# Patient Record
Sex: Female | Born: 2000 | Race: Black or African American | Hispanic: No | Marital: Single | Smoking: Former smoker
Health system: Southern US, Community
[De-identification: ages and names within clinical notes are randomized; demographics above are authoritative.]

## PROBLEM LIST (undated history)

## (undated) ENCOUNTER — Inpatient Hospital Stay: Payer: Self-pay

## (undated) HISTORY — PX: NO PAST SURGERIES: SHX2092

---
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
## 2018-10-21 LAB — HM HIV SCREENING LAB: HM HIV Screening: NEGATIVE

## 2018-10-21 LAB — HM HEPATITIS C SCREENING LAB: HM Hepatitis Screen: NEGATIVE

## 2019-03-11 DIAGNOSIS — E669 Obesity, unspecified: Secondary | ICD-10-CM

## 2019-03-12 ENCOUNTER — Ambulatory Visit (LOCAL_COMMUNITY_HEALTH_CENTER): Payer: Medicaid Other | Admitting: Physician Assistant

## 2019-03-12 ENCOUNTER — Ambulatory Visit: Payer: Medicaid Other

## 2019-03-12 ENCOUNTER — Other Ambulatory Visit: Payer: Self-pay

## 2019-03-12 ENCOUNTER — Encounter: Payer: Self-pay | Admitting: Physician Assistant

## 2019-03-12 VITALS — BP 112/66 | Ht 62.0 in | Wt 188.0 lb

## 2019-03-12 DIAGNOSIS — Z3009 Encounter for other general counseling and advice on contraception: Secondary | ICD-10-CM | POA: Diagnosis not present

## 2019-03-12 DIAGNOSIS — Z30013 Encounter for initial prescription of injectable contraceptive: Secondary | ICD-10-CM | POA: Diagnosis not present

## 2019-03-12 MED ORDER — MEDROXYPROGESTERONE ACETATE 150 MG/ML IM SUSP
150.0000 mg | Freq: Once | INTRAMUSCULAR | Status: AC
  Administered 2019-03-12: 11:00:00 150 mg via INTRAMUSCULAR

## 2019-03-12 NOTE — Progress Notes (Signed)
Pt here for Depo restart. Last Depo per [facility] records was 02/06/2018. Pt desires Depo restart and is currently using condoms as birth control and reports condom use with all sex. Pt concerned about her menstraul period as she started 02/18/2019 and is still on period now. Reports flow ranges from heavy to spotting.Ronny Bacon, RN

## 2019-03-12 NOTE — Progress Notes (Signed)
Pt received Depo 150mg  IM today per Antoine Primas, PA order and pt tolerated well. Provider orders completed.Ronny Bacon, RN

## 2019-03-12 NOTE — Progress Notes (Signed)
   [facility] problem visit  [place] Department  Subjective:  Laura Higgins is a 18 y.o. being seen today for to restart Depo.  Chief Complaint  Patient presents with  . Contraception    restart Depo    HPI  Patient with irregular bleeding for almost 1 month.  Per chart, last RP was 11/2017 and last Depo was 01/2018.  Patient states that starting in January of 2020, started having regular periods and then in September she started bleeding and is still bleeding with the flow varying between light and heavy.  Reports that she did start a new job and that has been stressful.  Declines pelvic today due to bleeding.  Would like Depo in arm today.   Does the patient have a current or past history of drug use? No   No components found for: HCV]   Health Maintenance Due  Topic Date Due  . HIV Screening  01/22/2014  . TETANUS/TDAP  01/22/2018  . INFLUENZA VACCINE  01/09/2019    Review of Systems  All other systems reviewed and are negative.   The following portions of the patient's history were reviewed and updated as appropriate: allergies, current medications, past family history, past medical history, past social history, past surgical history and problem list. Problem list updated.   See flowsheet for other program required questions.  Objective:   Vitals:   03/12/19 1004  BP: 112/66  Weight: 188 lb (85.3 kg)  Height: 5\' 2"  (1.575 m)    Physical Exam Vitals signs reviewed.  Constitutional:      Appearance: Normal appearance.  HENT:     Head: Normocephalic and atraumatic.  Pulmonary:     Effort: Pulmonary effort is normal.  Neurological:     Mental Status: She is alert and oriented to person, place, and time.  Psychiatric:        Mood and Affect: Mood normal.        Behavior: Behavior normal.        Thought Content: Thought content normal.        Judgment: Judgment normal.       Assessment and Plan:  Laura Higgins is a 18  y.o. female presenting to the [facility] Department for a Women's Health problem visit  1. Encounter for counseling regarding contraception Counseled that irregular bleeding after d/c Depo is normal for up to 18 months.   Counseled that her irregular bleeding may be related to added stress of new job. Rec condoms with all sex for 2 weeks after shot today and always for STD protection. RTC in ~12 weeks for RP and depo.  2. Initiation of Depo Provera OK for Depo 150mg  IM today. Rec take IB 800 mg q 8 hr with food for 3-5 days in a row to help d/c bleeding along with getting Depo today. Call clinic if bleeding does not slow to spotting only or d/c after taking IB. - medroxyPROGESTERone (DEPO-PROVERA) injection 150 mg     No follow-ups on file.  No future appointments.  Jerene Dilling, PA

## 2019-05-24 ENCOUNTER — Ambulatory Visit: Payer: Medicaid Other

## 2019-06-08 ENCOUNTER — Ambulatory Visit: Payer: Medicaid Other

## 2019-06-16 ENCOUNTER — Ambulatory Visit (LOCAL_COMMUNITY_HEALTH_CENTER): Payer: Medicaid Other

## 2019-06-16 ENCOUNTER — Other Ambulatory Visit: Payer: Self-pay

## 2019-06-16 VITALS — BP 115/78 | Ht 60.0 in | Wt 187.0 lb

## 2019-06-16 DIAGNOSIS — Z3042 Encounter for surveillance of injectable contraceptive: Secondary | ICD-10-CM

## 2019-06-16 DIAGNOSIS — Z30013 Encounter for initial prescription of injectable contraceptive: Secondary | ICD-10-CM | POA: Diagnosis not present

## 2019-06-16 DIAGNOSIS — Z3009 Encounter for other general counseling and advice on contraception: Secondary | ICD-10-CM

## 2019-06-16 MED ORDER — MULTIVITAMINS PO CAPS: 1.0000 | ORAL_CAPSULE | Freq: Every day | ORAL | 0 refills | Status: DC

## 2019-06-16 MED ORDER — MEDROXYPROGESTERONE ACETATE 150 MG/ML IM SUSP
150.0000 mg | Freq: Once | INTRAMUSCULAR | Status: AC
  Administered 2019-06-16: 150 mg via INTRAMUSCULAR

## 2019-06-16 NOTE — Progress Notes (Signed)
Last physical at [facility] was 11/13/2017 and pap due 01/2020 (per [facility] records). Last provider visit at [facility] was 03/12/2019, restarted depo, one time depo order by Sadie Haber, PA; 13.5 weeks post depo today. Consulted with provider regarding pt request to continue with depo today. DMPA 150 mg IM administered today per verbal order by Samara Snide, PA. Pt expresses understanding to schedule a physical when next depo is due. MVI dispensed per standing order.

## 2019-08-04 ENCOUNTER — Ambulatory Visit: Payer: Self-pay | Admitting: Advanced Practice Midwife

## 2019-08-04 ENCOUNTER — Other Ambulatory Visit: Payer: Self-pay

## 2019-08-04 ENCOUNTER — Encounter: Payer: Self-pay | Admitting: Advanced Practice Midwife

## 2019-08-04 DIAGNOSIS — F17200 Nicotine dependence, unspecified, uncomplicated: Secondary | ICD-10-CM | POA: Insufficient documentation

## 2019-08-04 DIAGNOSIS — Z113 Encounter for screening for infections with a predominantly sexual mode of transmission: Secondary | ICD-10-CM

## 2019-08-04 NOTE — Progress Notes (Signed)
Here today for STD screening. Declines bloodwork. Zhaire Locker, RN ? ?

## 2019-08-04 NOTE — Progress Notes (Signed)
[place] results reviewed. Per standing orders no treatment indicated. Angela Platner, RN  

## 2019-08-04 NOTE — Progress Notes (Signed)
[facility] Department STI clinic/screening visit  Subjective:  Laura Higgins is a 19 y.o. SBF female being seen today for an STI screening visit. The patient reports they do have symptoms.  Patient reports that they do not desire a pregnancy in the next year.   They reported they are not interested in discussing contraception today.  Patient's last menstrual period was 07/28/2019 (approximate).   Patient has the following medical conditions:   Patient Active Problem List   Diagnosis Date Noted  . Obesity, unspecified 06/13/2015    Chief Complaint  Patient presents with  . SEXUALLY TRANSMITTED DISEASE    HPI  Patient reports cramps since last week in lower abdomen intermittently.  DMPA 01/2018.  03/2019, 06/16/19.   See flowsheet for further details and programmatic requirements.    The following portions of the patient's history were reviewed and updated as appropriate: allergies, current medications, past medical history, past social history, past surgical history and problem list.  Objective:  There were no vitals filed for this visit.  Physical Exam Vitals and nursing note reviewed.  Constitutional:      Appearance: Normal appearance.  HENT:     Head: Normocephalic and atraumatic.     Mouth/Throat:     Mouth: Mucous membranes are moist.     Pharynx: Oropharynx is clear. No oropharyngeal exudate or posterior oropharyngeal erythema.  Pulmonary:     Effort: Pulmonary effort is normal.  Abdominal:     General: Abdomen is flat.     Palpations: There is no mass.     Tenderness: There is no abdominal tenderness. There is no rebound.  Genitourinary:    General: Normal vulva.     Exam position: Lithotomy position.     Pubic Area: No rash or pubic lice.      Labia:        Right: No rash or lesion.        Left: No rash or lesion.      Vagina: Bleeding (red menses bleeding) present. No vaginal discharge, erythema or lesions.     Cervix: Normal.     Uterus:  Normal.      Adnexa: Right adnexa normal and left adnexa normal.     Rectum: Normal.  Lymphadenopathy:     Head:     Right side of head: No preauricular or posterior auricular adenopathy.     Left side of head: No preauricular or posterior auricular adenopathy.     Cervical: No cervical adenopathy.     Upper Body:     Right upper body: No supraclavicular or axillary adenopathy.     Left upper body: No supraclavicular or axillary adenopathy.     Lower Body: No right inguinal adenopathy. No left inguinal adenopathy.  Skin:    General: Skin is warm and dry.     Findings: No rash.  Neurological:     Mental Status: She is alert and oriented to person, place, and time.   No CMT   Assessment and Plan:  Laura Higgins is a 19 y.o. female presenting to the [facility] Department for STI screening  1. Screening examination for venereal disease Treat wet [place] per standing orders Immunization nurse consult Next DMPA due 09/01/19.  This bleeding is because pt has reinitiated DMPA.  Pt counseled may take Ibuprofen 800 mg q 8 hrs with food.  If symptoms worsen then go to ER - [facility], [facility], [facility]  Return if symptoms worsen or fail to improve.  No future appointments.  Alberteen Spindle, CNM

## 2019-08-05 LAB — WET PREP FOR TRICH, YEAST, CLUE
Trichomonas Exam: NEGATIVE
Yeast Exam: NEGATIVE

## 2019-08-09 DIAGNOSIS — A749 Chlamydial infection, unspecified: Secondary | ICD-10-CM

## 2019-08-09 HISTORY — DX: Chlamydial infection, unspecified: A74.9

## 2019-08-12 ENCOUNTER — Other Ambulatory Visit: Payer: Self-pay

## 2019-08-12 ENCOUNTER — Telehealth: Payer: Self-pay

## 2019-08-12 ENCOUNTER — Ambulatory Visit: Payer: Self-pay

## 2019-08-12 DIAGNOSIS — A749 Chlamydial infection, unspecified: Secondary | ICD-10-CM

## 2019-08-12 MED ORDER — AZITHROMYCIN 500 MG PO TABS
1000.0000 mg | ORAL_TABLET | Freq: Once | ORAL | Status: AC
Start: 2019-08-12 — End: 2019-08-12
  Administered 2019-08-12: 1000 mg via ORAL

## 2019-08-12 NOTE — Telephone Encounter (Signed)
TC to patient. Verified ID via password/SS#. Informed of positive Chlamydia and need for tx. Instructed to eat before visit and have partner call for tx appt. Appt scheduled. Quinterius Gaida, RN   

## 2019-09-29 ENCOUNTER — Ambulatory Visit: Payer: Medicaid Other

## 2019-11-10 ENCOUNTER — Ambulatory Visit (LOCAL_COMMUNITY_HEALTH_CENTER): Payer: Medicaid Other | Admitting: Advanced Practice Midwife

## 2019-11-10 ENCOUNTER — Encounter: Payer: Self-pay | Admitting: Advanced Practice Midwife

## 2019-11-10 ENCOUNTER — Other Ambulatory Visit: Payer: Self-pay

## 2019-11-10 VITALS — BP 111/73 | Ht 60.0 in | Wt 188.4 lb

## 2019-11-10 DIAGNOSIS — Z30013 Encounter for initial prescription of injectable contraceptive: Secondary | ICD-10-CM | POA: Diagnosis not present

## 2019-11-10 DIAGNOSIS — Z3009 Encounter for other general counseling and advice on contraception: Secondary | ICD-10-CM | POA: Diagnosis not present

## 2019-11-10 DIAGNOSIS — A599 Trichomoniasis, unspecified: Secondary | ICD-10-CM | POA: Insufficient documentation

## 2019-11-10 DIAGNOSIS — F1290 Cannabis use, unspecified, uncomplicated: Secondary | ICD-10-CM | POA: Insufficient documentation

## 2019-11-10 LAB — WET PREP FOR TRICH, YEAST, CLUE
Trichomonas Exam: POSITIVE — AB
Yeast Exam: NEGATIVE

## 2019-11-10 LAB — PREGNANCY, URINE: Preg Test, Ur: NEGATIVE

## 2019-11-10 MED ORDER — MEDROXYPROGESTERONE ACETATE 150 MG/ML IM SUSP
150.0000 mg | INTRAMUSCULAR | Status: AC
Start: 2019-11-10 — End: 2020-11-04
  Administered 2019-11-10: 150 mg via INTRAMUSCULAR

## 2019-11-10 MED ORDER — MULTIVITAMINS PO CAPS: 1.0000 | ORAL_CAPSULE | Freq: Every day | ORAL | 0 refills | Status: AC

## 2019-11-10 MED ORDER — METRONIDAZOLE 500 MG PO TABS: 2000.0000 mg | ORAL_TABLET | Freq: Once | ORAL | 0 refills | Status: AC

## 2019-11-10 NOTE — Progress Notes (Signed)
[employer] results reviewed. Patient treated for Trich per standing orders. Depo given and tolerated well. Tawny Hopping, RN

## 2019-11-10 NOTE — Progress Notes (Signed)
Family Planning Visit- Initial Visit  Subjective:  Laura Higgins is a 19 y.o. SBF nullip smoker G0P0000   being seen today for an initial well woman visit and to discuss family planning options.  She is currently using late for DMPA--nothing for pregnancy prevention. Patient reports she does not want a pregnancy in the next year.  Patient has the following medical conditions has Obesity BMI=36.7; Smoker Black & Milds 2/day; Marijuana use daily; and Trichomonas infection on their problem list.  Chief Complaint  Patient presents with  . Contraception  . Gynecologic Exam    Patient reports Last DMPA 06/16/19.  Last PE 11/13/17.  Smokes 2 Black & Milds/day.  Last MJ yesterday daily.  LMP 11/08/19.  Last sex 10/23/19 with condom; with partner x 4 mo.  1 sex partner in last 3 mo.  Last ETOH 09/24/19 (4 shots Henessey) 1x/mo.  Patient denies   Body mass index is 36.79 kg/m. - Patient is eligible for diabetes screening based on BMI and age >56?  not applicable EP3I ordered? not applicable  Patient reports 1 of partners in last year. Desires STI screening?  Yes  Has patient been screened once for HCV in the past?  No  No results found for: HCVAB  Does the patient have current of drug use, have a partner with drug use, and/or has been incarcerated since last result? No  If yes-- Screen for HCV through [facility]   Does the patient meet criteria for HBV testing? No  Criteria:  -Household, sexual or needle sharing contact with HBV -History of drug use -HIV positive -Those with known Hep C   Health Maintenance Due  Topic Date Due  . COVID-19 Vaccine (1) Never done  . CHLAMYDIA SCREENING  Never done  . TETANUS/TDAP  Never done    Review of Systems  All other systems reviewed and are negative.   The following portions of the patient's history were reviewed and updated as appropriate: allergies, current medications, past family history, past medical history, past social history, past  surgical history and problem list. Problem list updated.   See flowsheet for other program required questions.  Objective:   Vitals:   11/10/19 1037  BP: 111/73  Weight: 188 lb 6.4 oz (85.5 kg)  Height: 5' (1.524 m)    Physical Exam Constitutional:      Appearance: Normal appearance. She is normal weight.  HENT:     Head: Normocephalic and atraumatic.     Mouth/Throat:     Mouth: Mucous membranes are moist.  Eyes:     Conjunctiva/sclera: Conjunctivae normal.  Cardiovascular:     Rate and Rhythm: Normal rate and regular rhythm.  Pulmonary:     Effort: Pulmonary effort is normal.     Breath sounds: Normal breath sounds.  Chest:     Breasts:        Right: Normal.        Left: Normal.  Abdominal:     Palpations: Abdomen is soft.     Comments: Soft without tenderness, poor tone, increased adipose  Genitourinary:    General: Normal vulva.     Exam position: Lithotomy position.     Vagina: Vaginal discharge (grey creamy leukorrhea, ph>4.5 with malodor) present.     Cervix: Normal.     Uterus: Normal.      Adnexa: Right adnexa normal and left adnexa normal.     Rectum: Normal.  Musculoskeletal:        General: Normal  range of motion.     Cervical back: Normal range of motion and neck supple.  Skin:    General: Skin is warm and dry.  Neurological:     Mental Status: She is alert.  Psychiatric:        Mood and Affect: Mood normal.       Assessment and Plan:  Laura Higgins is a 19 y.o. female presenting to the [facility] Department for an initial well woman exam/family planning visit  Contraception counseling: Reviewed all forms of birth control options in the tiered based approach. available including abstinence; over the counter/barrier methods; hormonal contraceptive medication including pill, patch, ring, injection,contraceptive implant, ECP; hormonal and nonhormonal IUDs; permanent sterilization options including vasectomy and the various tubal  sterilization modalities. Risks, benefits, and typical effectiveness rates were reviewed.  Questions were answered.  Written information was also given to the patient to review.  Patient desires DMPA, this was prescribed for patient. She will follow up in 11-13 wks for surveillance.  She was told to call with any further questions, or with any concerns about this method of contraception.  Emphasized use of condoms 100% of the time for STI prevention.  Patient was offered ECP. ECP was not accepted by the patient. ECP counseling was not given - see RN documentation  1. Family planning Treat wet mount per standing orders Immunization nurse consult - WET PREP FOR TRICH, YEAST, CLUE - Pregnancy, urine - Chlamydia/Gonorrhea  Lab  2. Encounter for initial prescription of injectable contraceptive If PT neg today may have DMPA 150 mg IM q 11-13 wks x 1 year     Return for 11-13 wk DMPA.  No future appointments.  Alberteen Spindle, CNM

## 2019-11-10 NOTE — Progress Notes (Signed)
Here today for PE and Depo. Last PE was 11/13/2017 and last Depo was 06/16/2019 (21 weeks.) Tawny Hopping, RN

## 2020-01-04 ENCOUNTER — Ambulatory Visit: Payer: Medicaid Other | Admitting: Family Medicine

## 2020-01-04 ENCOUNTER — Other Ambulatory Visit: Payer: Self-pay

## 2020-01-04 ENCOUNTER — Encounter: Payer: Self-pay | Admitting: Family Medicine

## 2020-01-04 DIAGNOSIS — A599 Trichomoniasis, unspecified: Secondary | ICD-10-CM

## 2020-01-04 DIAGNOSIS — Z113 Encounter for screening for infections with a predominantly sexual mode of transmission: Secondary | ICD-10-CM

## 2020-01-04 LAB — WET PREP FOR TRICH, YEAST, CLUE
Trichomonas Exam: POSITIVE — AB
Yeast Exam: NEGATIVE

## 2020-01-04 MED ORDER — METRONIDAZOLE 500 MG PO TABS: 2000.0000 mg | ORAL_TABLET | Freq: Once | ORAL | 0 refills | Status: AC

## 2020-01-04 NOTE — Progress Notes (Signed)
Wet mount reviewed, patient treated for Trich per SO..Dayzha Pogosyan Brewer-Jensen, RN  ?

## 2020-01-04 NOTE — Progress Notes (Signed)
  [facility] Department STI clinic/screening visit  Subjective:  Laura Higgins is a 19 y.o. female being seen today for an STI screening visit. The patient reports they do have symptoms.  Patient reports that they do not desire a pregnancy in the next year.   They reported they are not interested in discussing contraception today. Client birth control method is Depo.   Patient's last menstrual period was 11/10/2019.   Patient has the following medical conditions:   Patient Active Problem List   Diagnosis Date Noted  . Marijuana use daily 11/10/2019  . Trichomonas infection 11/10/2019  . Smoker Black & Milds 2/day 08/04/2019  . Obesity BMI=36.7 06/13/2015    No chief complaint on file.   HPI  Patient reports she has had genital itching and a white discharge without odor x 3 days.  Client used OTC yeast cream x 2 days but symptoms not relieved  Last HIV test per patient/review of record was 10/21/2018 Patient reports last pap was- client < 35 yo   See flowsheet for further details and programmatic requirements.    The following portions of the patient's history were reviewed and updated as appropriate: allergies, current medications, past medical history, past social history, past surgical history and problem list.  Objective:  There were no vitals filed for this visit.  Physical Exam Constitutional:      Appearance: She is obese.  Abdominal:     General: There is no distension.     Tenderness: There is no abdominal tenderness.  Genitourinary:    General: Normal vulva.     Vagina: Vaginal discharge present.  Lymphadenopathy:     Cervical: No cervical adenopathy.     Upper Body:     Right upper body: No supraclavicular or axillary adenopathy.     Left upper body: No supraclavicular or axillary adenopathy.     Lower Body: No right inguinal adenopathy. No left inguinal adenopathy.  Skin:    General: Skin is warm and dry.  Neurological:     Mental Status:  She is alert and oriented to person, place, and time.    Assessment and Plan:  Laura Higgins is a 19 y.o. female presenting to the [facility] Department for STI screening  1. Screening examination for venereal disease Treat wet prep as per SO,. - WET PREP FOR TRICH, YEAST, CLUE - Chlamydia/Gonorrhea [place] [facility]     No follow-ups on file.  Future Appointments  Date Time Provider Department Center  01/05/2020  8:20 AM [facility] NURSE [facility] None    Larene Pickett, FNP

## 2020-01-07 ENCOUNTER — Telehealth: Payer: Self-pay

## 2020-01-07 NOTE — Telephone Encounter (Signed)
[facility] as scheduled for treatment in Nurse Clinic this am. Call to client who states forgot about appt. Due to work schedule, requests appt be rescheduled for Monday with arrival time of 8:00 am. Client aware may eat a full breakfast and take medicine in clinic or can be given medicine to take with a full meal later in day. Per client, vomited after taking medicine received from [facility] on 01/04/2020. Client counseled that agency unable to provide her a note to miss work "just in case" vomits again. Jossie Ng, RN

## 2020-01-09 DIAGNOSIS — A599 Trichomoniasis, unspecified: Secondary | ICD-10-CM

## 2020-01-09 HISTORY — DX: Trichomoniasis, unspecified: A59.9

## 2020-01-10 ENCOUNTER — Ambulatory Visit: Payer: Medicaid Other

## 2020-01-10 ENCOUNTER — Other Ambulatory Visit: Payer: Self-pay

## 2020-01-10 DIAGNOSIS — A599 Trichomoniasis, unspecified: Secondary | ICD-10-CM

## 2020-01-10 MED ORDER — METRONIDAZOLE 500 MG PO TABS: 2000.0000 mg | ORAL_TABLET | Freq: Once | ORAL | 0 refills | Status: AC

## 2020-01-10 NOTE — Progress Notes (Signed)
Client counseled to eat a good meal prior to taking Metronidazole. States when last taking medicine first 2 pills went okay, but third pill got "stuck" in throat and begin to dissolve and taste made her vomit. Encouraged to take medicine with applesauce, pudding, ice cream or any thing that would help her swallow it easily. Jossie Ng, RN

## 2020-01-12 ENCOUNTER — Telehealth: Payer: Self-pay | Admitting: Family Medicine

## 2020-01-12 NOTE — Telephone Encounter (Signed)
Call to patient to inform of positive chlamydia  infection.  No answer, left message on machine.  Will send mychart message. Wendi Snipes, RN

## 2020-01-12 NOTE — Telephone Encounter (Signed)
Patient returned call, verified by password, patient informed of + chlamydia.  Appointment scheduled.  Wendi Snipes, RN

## 2020-01-18 ENCOUNTER — Ambulatory Visit: Payer: Self-pay

## 2020-01-18 ENCOUNTER — Other Ambulatory Visit: Payer: Self-pay

## 2020-01-18 DIAGNOSIS — A749 Chlamydial infection, unspecified: Secondary | ICD-10-CM

## 2020-01-18 MED ORDER — AZITHROMYCIN 500 MG PO TABS
1000.0000 mg | ORAL_TABLET | Freq: Once | ORAL | Status: AC
  Administered 2020-01-18: 1000 mg via ORAL

## 2020-02-02 ENCOUNTER — Ambulatory Visit: Payer: Medicaid Other

## 2020-02-28 ENCOUNTER — Ambulatory Visit: Payer: Medicaid Other

## 2020-02-29 ENCOUNTER — Ambulatory Visit: Payer: Medicaid Other

## 2020-03-03 ENCOUNTER — Ambulatory Visit: Payer: Medicaid Other

## 2020-03-10 ENCOUNTER — Ambulatory Visit: Payer: Medicaid Other

## 2020-03-15 ENCOUNTER — Ambulatory Visit: Payer: Medicaid Other

## 2020-03-24 ENCOUNTER — Ambulatory Visit: Payer: Medicaid Other | Admitting: Physician Assistant

## 2020-03-24 ENCOUNTER — Other Ambulatory Visit: Payer: Self-pay

## 2020-03-24 DIAGNOSIS — Z113 Encounter for screening for infections with a predominantly sexual mode of transmission: Secondary | ICD-10-CM

## 2020-03-24 LAB — WET PREP FOR TRICH, YEAST, CLUE
Trichomonas Exam: NEGATIVE
Yeast Exam: NEGATIVE

## 2020-03-24 NOTE — Progress Notes (Signed)
Wet mount reviewed, no tx per provider orders. Provider orders completed. 

## 2020-03-26 ENCOUNTER — Encounter: Payer: Self-pay | Admitting: Physician Assistant

## 2020-03-26 NOTE — Progress Notes (Signed)
  [facility] Department STI clinic/screening visit  Subjective:  Laura Higgins is a 19 y.o. female being seen today for an STI screening visit. The patient reports they do not have symptoms.  Patient reports that they do not desire a pregnancy in the next year.   They reported they are not interested in discussing contraception today.  No LMP recorded. Patient has had an injection.   Patient has the following medical conditions:   Patient Active Problem List   Diagnosis Date Noted  . Marijuana use daily 11/10/2019  . Trichomonas infection 11/10/2019  . Smoker Black & Milds 2/day 08/04/2019  . Obesity BMI=36.7 06/13/2015    Chief Complaint  Patient presents with  . SEXUALLY TRANSMITTED DISEASE    STD screening including bloodwork    HPI  Patient reports that she is here for re-screening today.  Denies having symptoms today.  Reports last HIV test was in 2020 and that she has not had her first pap yet.  Reports she last had a Depo in in August.   See flowsheet for further details and programmatic requirements.    The following portions of the patient's history were reviewed and updated as appropriate: allergies, current medications, past medical history, past social history, past surgical history and problem list.  Objective:  There were no vitals filed for this visit.  Physical Exam Constitutional:      General: She is not in acute distress.    Appearance: Normal appearance.  HENT:     Head: Normocephalic and atraumatic.     Comments: No nits,lice, or hair loss. No cervical, supraclavicular or axillary adenopathy. Eyes:     Conjunctiva/sclera: Conjunctivae normal.  Pulmonary:     Effort: Pulmonary effort is normal.  Skin:    General: Skin is warm and dry.     Findings: No bruising, erythema, lesion or rash.  Neurological:     Mental Status: She is alert and oriented to person, place, and time.  Psychiatric:        Mood and Affect: Mood normal.         Behavior: Behavior normal.        Thought Content: Thought content normal.        Judgment: Judgment normal.      Assessment and Plan:  Laura Higgins is a 19 y.o. female presenting to the [facility] Department for STI screening  1. Screening for STD (sexually transmitted disease) Patient into clinic without symptoms. Patient desires to self-collect vaginal samples today.  Counseled patient how to collect for accurate results.  Rec condoms with all sex. Await test results.  Counseled that RN will call if needs to RTC for treatment once results are back. - WET PREP FOR TRICH, YEAST, CLUE - Chlamydia/Gonorrhea [place] [facility] - HIV [place] [facility] - Syphilis Serology,  Lab     No follow-ups on file.  No future appointments.  Matt Holmes, PA

## 2020-08-29 ENCOUNTER — Ambulatory Visit: Payer: Medicaid Other

## 2020-09-12 ENCOUNTER — Encounter: Payer: Self-pay | Admitting: Physician Assistant

## 2020-09-12 ENCOUNTER — Other Ambulatory Visit: Payer: Self-pay

## 2020-09-12 ENCOUNTER — Ambulatory Visit: Payer: Medicaid Other | Admitting: Physician Assistant

## 2020-09-12 DIAGNOSIS — Z113 Encounter for screening for infections with a predominantly sexual mode of transmission: Secondary | ICD-10-CM | POA: Diagnosis not present

## 2020-09-12 LAB — WET PREP FOR TRICH, YEAST, CLUE
Trichomonas Exam: NEGATIVE
Yeast Exam: NEGATIVE

## 2020-09-13 ENCOUNTER — Encounter: Payer: Self-pay | Admitting: Physician Assistant

## 2020-09-13 NOTE — Progress Notes (Signed)
  [facility] Department STI clinic/screening visit  Subjective:  Laura Higgins is a 20 y.o. female being seen today for an STI screening visit. The patient reports they do have symptoms.  Patient reports that they do not desire a pregnancy in the next year.   They reported they are not interested in discussing contraception today.  Patient's last menstrual period was 09/04/2020.   Patient has the following medical conditions:   Patient Active Problem List   Diagnosis Date Noted  . Marijuana use daily 11/10/2019  . Trichomonas infection 11/10/2019  . Smoker Black & Milds 2/day 08/04/2019  . Obesity BMI=36.7 06/13/2015    Chief Complaint  Patient presents with  . SEXUALLY TRANSMITTED DISEASE    screening    HPI  Patient reports that she has had some internal itching for about 1 week.  Reports that she used an OTC yeast treatment and wants to make sure that she does not have yeast.  Denies other symptoms, chronic conditions and regular medicines.  States last HIV test was about 1 year ago and last pap was in 2021.   See flowsheet for further details and programmatic requirements.    The following portions of the patient's history were reviewed and updated as appropriate: allergies, current medications, past medical history, past social history, past surgical history and problem list.  Objective:  There were no vitals filed for this visit.  Physical Exam Constitutional:      General: She is not in acute distress.    Appearance: Normal appearance.  HENT:     Head: Normocephalic and atraumatic.  Eyes:     Conjunctiva/sclera: Conjunctivae normal.  Pulmonary:     Effort: Pulmonary effort is normal.  Skin:    General: Skin is warm and dry.     Findings: No bruising, erythema, lesion or rash.  Neurological:     Mental Status: She is alert and oriented to person, place, and time.  Psychiatric:        Mood and Affect: Mood normal.        Behavior: Behavior  normal.        Thought Content: Thought content normal.        Judgment: Judgment normal.      Assessment and Plan:  Laura Higgins is a 20 y.o. female presenting to the [facility] Department for STI screening  1. Screening for STD (sexually transmitted disease) Patient into clinic with symptoms. Patient declines blood work today.  Counseled how to self-collect vaginal samples for testing. Reviewed with patient that wet mount is normal and no treatment is indicated today. Rec condoms with all sex. Await test results.  Counseled that RN will call if needs to RTC for treatment once results are back. - WET PREP FOR TRICH, YEAST, CLUE - Chlamydia/Gonorrhea [place] [facility]     No follow-ups on file.  No future appointments.  Matt Holmes, PA

## 2020-11-05 ENCOUNTER — Emergency Department: Payer: Medicaid Other

## 2020-11-05 ENCOUNTER — Emergency Department
Admission: EM | Admit: 2020-11-05 | Discharge: 2020-11-05 | Disposition: A | Discharge status: Home / Self Care | Payer: Medicaid Other | Attending: Emergency Medicine | Admitting: Emergency Medicine

## 2020-11-05 ENCOUNTER — Other Ambulatory Visit: Payer: Self-pay

## 2020-11-05 ENCOUNTER — Encounter: Payer: Self-pay | Admitting: Physician Assistant

## 2020-11-05 DIAGNOSIS — R590 Localized enlarged lymph nodes: Secondary | ICD-10-CM | POA: Diagnosis not present

## 2020-11-05 DIAGNOSIS — M542 Cervicalgia: Secondary | ICD-10-CM | POA: Diagnosis not present

## 2020-11-05 DIAGNOSIS — F17210 Nicotine dependence, cigarettes, uncomplicated: Secondary | ICD-10-CM | POA: Insufficient documentation

## 2020-11-05 DIAGNOSIS — S199XXA Unspecified injury of neck, initial encounter: Secondary | ICD-10-CM | POA: Diagnosis not present

## 2020-11-05 DIAGNOSIS — M545 Low back pain, unspecified: Secondary | ICD-10-CM | POA: Diagnosis not present

## 2020-11-05 DIAGNOSIS — Y92410 Unspecified street and highway as the place of occurrence of the external cause: Secondary | ICD-10-CM | POA: Diagnosis not present

## 2020-11-05 DIAGNOSIS — M533 Sacrococcygeal disorders, not elsewhere classified: Secondary | ICD-10-CM | POA: Insufficient documentation

## 2020-11-05 DIAGNOSIS — M7918 Myalgia, other site: Secondary | ICD-10-CM

## 2020-11-05 DIAGNOSIS — M549 Dorsalgia, unspecified: Secondary | ICD-10-CM | POA: Diagnosis not present

## 2020-11-05 DIAGNOSIS — R52 Pain, unspecified: Secondary | ICD-10-CM | POA: Diagnosis not present

## 2020-11-05 LAB — POC URINE PREG, ED: Preg Test, Ur: NEGATIVE

## 2020-11-05 MED ORDER — ONDANSETRON 4 MG PO TBDP
4.0000 mg | ORAL_TABLET | Freq: Once | ORAL | Status: AC
  Administered 2020-11-05: 4 mg via ORAL
  Filled 2020-11-05: qty 1

## 2020-11-05 MED ORDER — HYDROCODONE-ACETAMINOPHEN 5-325 MG PO TABS
1.0000 | ORAL_TABLET | Freq: Once | ORAL | Status: AC
  Administered 2020-11-05: 1 via ORAL
  Filled 2020-11-05: qty 1

## 2020-11-05 MED ORDER — CYCLOBENZAPRINE HCL 5 MG PO TABS: 5.0000 mg | ORAL_TABLET | Freq: Three times a day (TID) | ORAL | 0 refills | Status: DC | PRN

## 2020-11-05 MED ORDER — IBUPROFEN 800 MG PO TABS: 800.0000 mg | ORAL_TABLET | Freq: Three times a day (TID) | ORAL | 0 refills | Status: DC | PRN
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
## 2020-11-05 NOTE — Discharge Instructions (Signed)
Your exam and CT scans are normal at this time.  You may expect to feel sore and stiff for the next several days.  Take the prescription meds as directed.  Follow-up with primary provider or [place] urgent care for ongoing symptoms.  Return to the ED if necessary.

## 2021-01-25 ENCOUNTER — Ambulatory Visit: Payer: Medicaid Other

## 2021-02-04 DIAGNOSIS — F321 Major depressive disorder, single episode, moderate: Secondary | ICD-10-CM | POA: Diagnosis not present

## 2021-02-05 DIAGNOSIS — F321 Major depressive disorder, single episode, moderate: Secondary | ICD-10-CM | POA: Diagnosis not present

## 2021-02-06 DIAGNOSIS — F321 Major depressive disorder, single episode, moderate: Secondary | ICD-10-CM | POA: Diagnosis not present

## 2021-02-07 DIAGNOSIS — F321 Major depressive disorder, single episode, moderate: Secondary | ICD-10-CM | POA: Diagnosis not present

## 2021-02-08 DIAGNOSIS — F321 Major depressive disorder, single episode, moderate: Secondary | ICD-10-CM | POA: Diagnosis not present

## 2021-02-11 DIAGNOSIS — F321 Major depressive disorder, single episode, moderate: Secondary | ICD-10-CM | POA: Diagnosis not present

## 2021-02-12 DIAGNOSIS — F321 Major depressive disorder, single episode, moderate: Secondary | ICD-10-CM | POA: Diagnosis not present

## 2021-02-13 DIAGNOSIS — F321 Major depressive disorder, single episode, moderate: Secondary | ICD-10-CM | POA: Diagnosis not present

## 2021-02-14 DIAGNOSIS — F321 Major depressive disorder, single episode, moderate: Secondary | ICD-10-CM | POA: Diagnosis not present

## 2021-02-15 DIAGNOSIS — F321 Major depressive disorder, single episode, moderate: Secondary | ICD-10-CM | POA: Diagnosis not present

## 2021-02-18 DIAGNOSIS — F321 Major depressive disorder, single episode, moderate: Secondary | ICD-10-CM | POA: Diagnosis not present

## 2021-02-19 DIAGNOSIS — F321 Major depressive disorder, single episode, moderate: Secondary | ICD-10-CM | POA: Diagnosis not present

## 2021-02-20 DIAGNOSIS — F321 Major depressive disorder, single episode, moderate: Secondary | ICD-10-CM | POA: Diagnosis not present

## 2021-02-21 DIAGNOSIS — F321 Major depressive disorder, single episode, moderate: Secondary | ICD-10-CM | POA: Diagnosis not present

## 2021-02-22 DIAGNOSIS — F321 Major depressive disorder, single episode, moderate: Secondary | ICD-10-CM | POA: Diagnosis not present

## 2021-02-25 DIAGNOSIS — F321 Major depressive disorder, single episode, moderate: Secondary | ICD-10-CM | POA: Diagnosis not present

## 2021-02-26 DIAGNOSIS — F321 Major depressive disorder, single episode, moderate: Secondary | ICD-10-CM | POA: Diagnosis not present

## 2021-02-27 DIAGNOSIS — F321 Major depressive disorder, single episode, moderate: Secondary | ICD-10-CM | POA: Diagnosis not present

## 2021-02-28 DIAGNOSIS — F321 Major depressive disorder, single episode, moderate: Secondary | ICD-10-CM | POA: Diagnosis not present

## 2021-03-01 DIAGNOSIS — F321 Major depressive disorder, single episode, moderate: Secondary | ICD-10-CM | POA: Diagnosis not present

## 2021-03-04 DIAGNOSIS — F321 Major depressive disorder, single episode, moderate: Secondary | ICD-10-CM | POA: Diagnosis not present

## 2021-03-05 DIAGNOSIS — F321 Major depressive disorder, single episode, moderate: Secondary | ICD-10-CM | POA: Diagnosis not present

## 2021-03-07 DIAGNOSIS — F321 Major depressive disorder, single episode, moderate: Secondary | ICD-10-CM | POA: Diagnosis not present

## 2021-03-09 DIAGNOSIS — F321 Major depressive disorder, single episode, moderate: Secondary | ICD-10-CM | POA: Diagnosis not present

## 2021-03-10 DIAGNOSIS — F321 Major depressive disorder, single episode, moderate: Secondary | ICD-10-CM | POA: Diagnosis not present

## 2021-03-11 DIAGNOSIS — F321 Major depressive disorder, single episode, moderate: Secondary | ICD-10-CM | POA: Diagnosis not present

## 2021-03-12 DIAGNOSIS — F321 Major depressive disorder, single episode, moderate: Secondary | ICD-10-CM | POA: Diagnosis not present

## 2021-03-13 DIAGNOSIS — F321 Major depressive disorder, single episode, moderate: Secondary | ICD-10-CM | POA: Diagnosis not present

## 2021-03-14 DIAGNOSIS — F321 Major depressive disorder, single episode, moderate: Secondary | ICD-10-CM | POA: Diagnosis not present

## 2021-03-15 DIAGNOSIS — F321 Major depressive disorder, single episode, moderate: Secondary | ICD-10-CM | POA: Diagnosis not present

## 2021-03-18 DIAGNOSIS — F321 Major depressive disorder, single episode, moderate: Secondary | ICD-10-CM | POA: Diagnosis not present

## 2021-03-19 DIAGNOSIS — F321 Major depressive disorder, single episode, moderate: Secondary | ICD-10-CM | POA: Diagnosis not present

## 2021-03-20 DIAGNOSIS — F321 Major depressive disorder, single episode, moderate: Secondary | ICD-10-CM | POA: Diagnosis not present

## 2021-03-21 DIAGNOSIS — F321 Major depressive disorder, single episode, moderate: Secondary | ICD-10-CM | POA: Diagnosis not present

## 2021-03-22 DIAGNOSIS — F321 Major depressive disorder, single episode, moderate: Secondary | ICD-10-CM | POA: Diagnosis not present

## 2021-03-25 DIAGNOSIS — F321 Major depressive disorder, single episode, moderate: Secondary | ICD-10-CM | POA: Diagnosis not present

## 2021-03-26 DIAGNOSIS — F321 Major depressive disorder, single episode, moderate: Secondary | ICD-10-CM | POA: Diagnosis not present

## 2021-03-27 DIAGNOSIS — F321 Major depressive disorder, single episode, moderate: Secondary | ICD-10-CM | POA: Diagnosis not present

## 2021-03-28 DIAGNOSIS — F321 Major depressive disorder, single episode, moderate: Secondary | ICD-10-CM | POA: Diagnosis not present

## 2021-03-29 DIAGNOSIS — F321 Major depressive disorder, single episode, moderate: Secondary | ICD-10-CM | POA: Diagnosis not present

## 2021-04-01 DIAGNOSIS — F321 Major depressive disorder, single episode, moderate: Secondary | ICD-10-CM | POA: Diagnosis not present

## 2021-04-02 DIAGNOSIS — F321 Major depressive disorder, single episode, moderate: Secondary | ICD-10-CM | POA: Diagnosis not present

## 2021-04-03 DIAGNOSIS — F321 Major depressive disorder, single episode, moderate: Secondary | ICD-10-CM | POA: Diagnosis not present

## 2021-04-04 DIAGNOSIS — F321 Major depressive disorder, single episode, moderate: Secondary | ICD-10-CM | POA: Diagnosis not present

## 2021-04-05 DIAGNOSIS — F321 Major depressive disorder, single episode, moderate: Secondary | ICD-10-CM | POA: Diagnosis not present

## 2021-04-08 DIAGNOSIS — F321 Major depressive disorder, single episode, moderate: Secondary | ICD-10-CM | POA: Diagnosis not present

## 2021-04-09 DIAGNOSIS — F321 Major depressive disorder, single episode, moderate: Secondary | ICD-10-CM | POA: Diagnosis not present

## 2021-04-10 DIAGNOSIS — F321 Major depressive disorder, single episode, moderate: Secondary | ICD-10-CM | POA: Diagnosis not present

## 2021-04-11 DIAGNOSIS — F321 Major depressive disorder, single episode, moderate: Secondary | ICD-10-CM | POA: Diagnosis not present

## 2021-04-12 DIAGNOSIS — F321 Major depressive disorder, single episode, moderate: Secondary | ICD-10-CM | POA: Diagnosis not present

## 2021-04-15 DIAGNOSIS — F321 Major depressive disorder, single episode, moderate: Secondary | ICD-10-CM | POA: Diagnosis not present

## 2021-04-16 DIAGNOSIS — F321 Major depressive disorder, single episode, moderate: Secondary | ICD-10-CM | POA: Diagnosis not present

## 2021-04-17 DIAGNOSIS — F321 Major depressive disorder, single episode, moderate: Secondary | ICD-10-CM | POA: Diagnosis not present

## 2021-04-18 DIAGNOSIS — F321 Major depressive disorder, single episode, moderate: Secondary | ICD-10-CM | POA: Diagnosis not present

## 2021-04-19 DIAGNOSIS — F321 Major depressive disorder, single episode, moderate: Secondary | ICD-10-CM | POA: Diagnosis not present

## 2021-04-22 DIAGNOSIS — F321 Major depressive disorder, single episode, moderate: Secondary | ICD-10-CM | POA: Diagnosis not present

## 2021-04-23 DIAGNOSIS — F321 Major depressive disorder, single episode, moderate: Secondary | ICD-10-CM | POA: Diagnosis not present

## 2021-04-24 DIAGNOSIS — F321 Major depressive disorder, single episode, moderate: Secondary | ICD-10-CM | POA: Diagnosis not present

## 2021-04-25 DIAGNOSIS — F321 Major depressive disorder, single episode, moderate: Secondary | ICD-10-CM | POA: Diagnosis not present

## 2021-04-26 DIAGNOSIS — F321 Major depressive disorder, single episode, moderate: Secondary | ICD-10-CM | POA: Diagnosis not present

## 2021-04-29 DIAGNOSIS — F321 Major depressive disorder, single episode, moderate: Secondary | ICD-10-CM | POA: Diagnosis not present

## 2021-04-30 DIAGNOSIS — F321 Major depressive disorder, single episode, moderate: Secondary | ICD-10-CM | POA: Diagnosis not present

## 2021-05-01 DIAGNOSIS — F321 Major depressive disorder, single episode, moderate: Secondary | ICD-10-CM | POA: Diagnosis not present

## 2021-05-02 DIAGNOSIS — F321 Major depressive disorder, single episode, moderate: Secondary | ICD-10-CM | POA: Diagnosis not present

## 2021-05-05 DIAGNOSIS — F321 Major depressive disorder, single episode, moderate: Secondary | ICD-10-CM | POA: Diagnosis not present

## 2021-05-06 DIAGNOSIS — F321 Major depressive disorder, single episode, moderate: Secondary | ICD-10-CM | POA: Diagnosis not present

## 2021-05-08 DIAGNOSIS — F321 Major depressive disorder, single episode, moderate: Secondary | ICD-10-CM | POA: Diagnosis not present

## 2021-05-11 DIAGNOSIS — F321 Major depressive disorder, single episode, moderate: Secondary | ICD-10-CM | POA: Diagnosis not present

## 2021-05-14 DIAGNOSIS — F321 Major depressive disorder, single episode, moderate: Secondary | ICD-10-CM | POA: Diagnosis not present

## 2021-05-15 DIAGNOSIS — F321 Major depressive disorder, single episode, moderate: Secondary | ICD-10-CM | POA: Diagnosis not present

## 2021-05-16 DIAGNOSIS — F321 Major depressive disorder, single episode, moderate: Secondary | ICD-10-CM | POA: Diagnosis not present

## 2021-05-17 ENCOUNTER — Ambulatory Visit: Payer: Medicaid Other

## 2021-05-17 DIAGNOSIS — F321 Major depressive disorder, single episode, moderate: Secondary | ICD-10-CM | POA: Diagnosis not present

## 2021-05-20 DIAGNOSIS — F321 Major depressive disorder, single episode, moderate: Secondary | ICD-10-CM | POA: Diagnosis not present

## 2021-05-21 DIAGNOSIS — F321 Major depressive disorder, single episode, moderate: Secondary | ICD-10-CM | POA: Diagnosis not present

## 2021-05-22 DIAGNOSIS — F321 Major depressive disorder, single episode, moderate: Secondary | ICD-10-CM | POA: Diagnosis not present

## 2021-06-21 DIAGNOSIS — F321 Major depressive disorder, single episode, moderate: Secondary | ICD-10-CM | POA: Diagnosis not present

## 2021-06-22 DIAGNOSIS — F321 Major depressive disorder, single episode, moderate: Secondary | ICD-10-CM | POA: Diagnosis not present

## 2021-06-23 DIAGNOSIS — F321 Major depressive disorder, single episode, moderate: Secondary | ICD-10-CM | POA: Diagnosis not present

## 2021-06-25 DIAGNOSIS — F321 Major depressive disorder, single episode, moderate: Secondary | ICD-10-CM | POA: Diagnosis not present

## 2021-06-26 DIAGNOSIS — F321 Major depressive disorder, single episode, moderate: Secondary | ICD-10-CM | POA: Diagnosis not present

## 2021-06-27 DIAGNOSIS — F321 Major depressive disorder, single episode, moderate: Secondary | ICD-10-CM | POA: Diagnosis not present

## 2021-06-28 DIAGNOSIS — F321 Major depressive disorder, single episode, moderate: Secondary | ICD-10-CM | POA: Diagnosis not present

## 2021-06-29 DIAGNOSIS — F321 Major depressive disorder, single episode, moderate: Secondary | ICD-10-CM | POA: Diagnosis not present

## 2021-07-02 DIAGNOSIS — F321 Major depressive disorder, single episode, moderate: Secondary | ICD-10-CM | POA: Diagnosis not present

## 2021-07-03 DIAGNOSIS — F321 Major depressive disorder, single episode, moderate: Secondary | ICD-10-CM | POA: Diagnosis not present

## 2021-07-04 DIAGNOSIS — F321 Major depressive disorder, single episode, moderate: Secondary | ICD-10-CM | POA: Diagnosis not present

## 2021-07-05 DIAGNOSIS — F321 Major depressive disorder, single episode, moderate: Secondary | ICD-10-CM | POA: Diagnosis not present

## 2021-07-06 DIAGNOSIS — F321 Major depressive disorder, single episode, moderate: Secondary | ICD-10-CM | POA: Diagnosis not present

## 2021-07-09 DIAGNOSIS — F321 Major depressive disorder, single episode, moderate: Secondary | ICD-10-CM | POA: Diagnosis not present

## 2021-07-10 DIAGNOSIS — F321 Major depressive disorder, single episode, moderate: Secondary | ICD-10-CM | POA: Diagnosis not present

## 2021-07-11 DIAGNOSIS — F321 Major depressive disorder, single episode, moderate: Secondary | ICD-10-CM | POA: Diagnosis not present

## 2021-07-12 DIAGNOSIS — F321 Major depressive disorder, single episode, moderate: Secondary | ICD-10-CM | POA: Diagnosis not present

## 2021-07-13 DIAGNOSIS — F321 Major depressive disorder, single episode, moderate: Secondary | ICD-10-CM | POA: Diagnosis not present

## 2021-07-16 DIAGNOSIS — F321 Major depressive disorder, single episode, moderate: Secondary | ICD-10-CM | POA: Diagnosis not present

## 2021-07-17 DIAGNOSIS — F321 Major depressive disorder, single episode, moderate: Secondary | ICD-10-CM | POA: Diagnosis not present

## 2021-07-18 DIAGNOSIS — F321 Major depressive disorder, single episode, moderate: Secondary | ICD-10-CM | POA: Diagnosis not present

## 2021-07-19 DIAGNOSIS — F321 Major depressive disorder, single episode, moderate: Secondary | ICD-10-CM | POA: Diagnosis not present

## 2021-07-20 DIAGNOSIS — F321 Major depressive disorder, single episode, moderate: Secondary | ICD-10-CM | POA: Diagnosis not present

## 2021-07-23 DIAGNOSIS — F321 Major depressive disorder, single episode, moderate: Secondary | ICD-10-CM | POA: Diagnosis not present

## 2021-07-24 DIAGNOSIS — F321 Major depressive disorder, single episode, moderate: Secondary | ICD-10-CM | POA: Diagnosis not present

## 2021-07-25 DIAGNOSIS — F321 Major depressive disorder, single episode, moderate: Secondary | ICD-10-CM | POA: Diagnosis not present

## 2021-07-26 DIAGNOSIS — F321 Major depressive disorder, single episode, moderate: Secondary | ICD-10-CM | POA: Diagnosis not present

## 2021-07-27 DIAGNOSIS — F321 Major depressive disorder, single episode, moderate: Secondary | ICD-10-CM | POA: Diagnosis not present

## 2021-07-28 DIAGNOSIS — Z7251 High risk heterosexual behavior: Secondary | ICD-10-CM | POA: Diagnosis not present

## 2021-07-30 DIAGNOSIS — F321 Major depressive disorder, single episode, moderate: Secondary | ICD-10-CM | POA: Diagnosis not present

## 2021-07-31 ENCOUNTER — Ambulatory Visit: Payer: Medicaid Other

## 2021-07-31 DIAGNOSIS — F321 Major depressive disorder, single episode, moderate: Secondary | ICD-10-CM | POA: Diagnosis not present

## 2021-08-01 DIAGNOSIS — F321 Major depressive disorder, single episode, moderate: Secondary | ICD-10-CM | POA: Diagnosis not present

## 2021-08-02 DIAGNOSIS — F321 Major depressive disorder, single episode, moderate: Secondary | ICD-10-CM | POA: Diagnosis not present

## 2021-08-03 DIAGNOSIS — F321 Major depressive disorder, single episode, moderate: Secondary | ICD-10-CM | POA: Diagnosis not present

## 2021-08-06 DIAGNOSIS — F321 Major depressive disorder, single episode, moderate: Secondary | ICD-10-CM | POA: Diagnosis not present

## 2021-08-07 DIAGNOSIS — F321 Major depressive disorder, single episode, moderate: Secondary | ICD-10-CM | POA: Diagnosis not present

## 2021-08-08 DIAGNOSIS — F321 Major depressive disorder, single episode, moderate: Secondary | ICD-10-CM | POA: Diagnosis not present

## 2021-08-09 DIAGNOSIS — F321 Major depressive disorder, single episode, moderate: Secondary | ICD-10-CM | POA: Diagnosis not present

## 2021-08-10 DIAGNOSIS — F321 Major depressive disorder, single episode, moderate: Secondary | ICD-10-CM | POA: Diagnosis not present

## 2021-08-13 DIAGNOSIS — F321 Major depressive disorder, single episode, moderate: Secondary | ICD-10-CM | POA: Diagnosis not present

## 2021-08-14 DIAGNOSIS — F321 Major depressive disorder, single episode, moderate: Secondary | ICD-10-CM | POA: Diagnosis not present

## 2021-08-15 DIAGNOSIS — F321 Major depressive disorder, single episode, moderate: Secondary | ICD-10-CM | POA: Diagnosis not present

## 2021-08-16 DIAGNOSIS — F321 Major depressive disorder, single episode, moderate: Secondary | ICD-10-CM | POA: Diagnosis not present

## 2021-08-17 DIAGNOSIS — F321 Major depressive disorder, single episode, moderate: Secondary | ICD-10-CM | POA: Diagnosis not present

## 2021-08-20 DIAGNOSIS — F321 Major depressive disorder, single episode, moderate: Secondary | ICD-10-CM | POA: Diagnosis not present

## 2021-08-21 DIAGNOSIS — F321 Major depressive disorder, single episode, moderate: Secondary | ICD-10-CM | POA: Diagnosis not present

## 2021-08-22 DIAGNOSIS — F321 Major depressive disorder, single episode, moderate: Secondary | ICD-10-CM | POA: Diagnosis not present

## 2021-08-23 DIAGNOSIS — F321 Major depressive disorder, single episode, moderate: Secondary | ICD-10-CM | POA: Diagnosis not present

## 2021-08-24 DIAGNOSIS — F321 Major depressive disorder, single episode, moderate: Secondary | ICD-10-CM | POA: Diagnosis not present

## 2021-08-27 DIAGNOSIS — F321 Major depressive disorder, single episode, moderate: Secondary | ICD-10-CM | POA: Diagnosis not present

## 2021-08-28 DIAGNOSIS — F321 Major depressive disorder, single episode, moderate: Secondary | ICD-10-CM | POA: Diagnosis not present

## 2021-08-29 DIAGNOSIS — F321 Major depressive disorder, single episode, moderate: Secondary | ICD-10-CM | POA: Diagnosis not present

## 2021-08-30 DIAGNOSIS — F321 Major depressive disorder, single episode, moderate: Secondary | ICD-10-CM | POA: Diagnosis not present

## 2021-08-31 DIAGNOSIS — F321 Major depressive disorder, single episode, moderate: Secondary | ICD-10-CM | POA: Diagnosis not present

## 2021-09-03 DIAGNOSIS — F321 Major depressive disorder, single episode, moderate: Secondary | ICD-10-CM | POA: Diagnosis not present

## 2021-09-04 DIAGNOSIS — F321 Major depressive disorder, single episode, moderate: Secondary | ICD-10-CM | POA: Diagnosis not present

## 2021-09-05 DIAGNOSIS — F321 Major depressive disorder, single episode, moderate: Secondary | ICD-10-CM | POA: Diagnosis not present

## 2021-09-06 DIAGNOSIS — F321 Major depressive disorder, single episode, moderate: Secondary | ICD-10-CM | POA: Diagnosis not present

## 2021-09-07 DIAGNOSIS — F321 Major depressive disorder, single episode, moderate: Secondary | ICD-10-CM | POA: Diagnosis not present

## 2021-09-13 ENCOUNTER — Ambulatory Visit: Payer: Medicaid Other

## 2021-09-17 DIAGNOSIS — F321 Major depressive disorder, single episode, moderate: Secondary | ICD-10-CM | POA: Diagnosis not present

## 2021-09-18 DIAGNOSIS — F321 Major depressive disorder, single episode, moderate: Secondary | ICD-10-CM | POA: Diagnosis not present

## 2021-09-19 DIAGNOSIS — F321 Major depressive disorder, single episode, moderate: Secondary | ICD-10-CM | POA: Diagnosis not present

## 2021-09-20 DIAGNOSIS — F321 Major depressive disorder, single episode, moderate: Secondary | ICD-10-CM | POA: Diagnosis not present

## 2021-09-21 DIAGNOSIS — F321 Major depressive disorder, single episode, moderate: Secondary | ICD-10-CM | POA: Diagnosis not present

## 2021-09-24 DIAGNOSIS — F321 Major depressive disorder, single episode, moderate: Secondary | ICD-10-CM | POA: Diagnosis not present

## 2021-09-25 DIAGNOSIS — F321 Major depressive disorder, single episode, moderate: Secondary | ICD-10-CM | POA: Diagnosis not present

## 2021-09-26 DIAGNOSIS — F321 Major depressive disorder, single episode, moderate: Secondary | ICD-10-CM | POA: Diagnosis not present

## 2021-09-27 DIAGNOSIS — F321 Major depressive disorder, single episode, moderate: Secondary | ICD-10-CM | POA: Diagnosis not present

## 2021-09-28 DIAGNOSIS — F321 Major depressive disorder, single episode, moderate: Secondary | ICD-10-CM | POA: Diagnosis not present

## 2021-12-14 ENCOUNTER — Other Ambulatory Visit: Payer: Medicaid Other

## 2022-02-09 ENCOUNTER — Encounter: Payer: Self-pay | Admitting: Emergency Medicine

## 2022-02-09 ENCOUNTER — Other Ambulatory Visit: Payer: Self-pay

## 2022-02-09 DIAGNOSIS — R1030 Lower abdominal pain, unspecified: Secondary | ICD-10-CM | POA: Insufficient documentation

## 2022-02-09 DIAGNOSIS — R112 Nausea with vomiting, unspecified: Secondary | ICD-10-CM | POA: Diagnosis not present

## 2022-02-09 DIAGNOSIS — Z5321 Procedure and treatment not carried out due to patient leaving prior to being seen by health care provider: Secondary | ICD-10-CM | POA: Insufficient documentation

## 2022-02-09 DIAGNOSIS — Z3A01 Less than 8 weeks gestation of pregnancy: Secondary | ICD-10-CM | POA: Diagnosis not present

## 2022-02-09 DIAGNOSIS — Z3201 Encounter for pregnancy test, result positive: Secondary | ICD-10-CM | POA: Insufficient documentation

## 2022-02-09 DIAGNOSIS — R102 Pelvic and perineal pain: Secondary | ICD-10-CM | POA: Diagnosis not present

## 2022-02-09 DIAGNOSIS — O26891 Other specified pregnancy related conditions, first trimester: Secondary | ICD-10-CM | POA: Diagnosis not present

## 2022-02-09 LAB — COMPREHENSIVE METABOLIC PANEL
ALT: 30 U/L (ref 0–44)
AST: 22 U/L (ref 15–41)
Albumin: 3.9 g/dL (ref 3.5–5.0)
Alkaline Phosphatase: 72 U/L (ref 38–126)
Anion gap: 7 (ref 5–15)
BUN: 13 mg/dL (ref 6–20)
CO2: 22 mmol/L (ref 22–32)
Calcium: 8.9 mg/dL (ref 8.9–10.3)
Chloride: 105 mmol/L (ref 98–111)
Creatinine, Ser: 0.72 mg/dL (ref 0.44–1.00)
GFR, Estimated: >60 mL/min (ref >60)
Glucose, Bld: 99 mg/dL (ref 70–99)
Potassium: 3.6 mmol/L (ref 3.5–5.1)
Sodium: 134 mmol/L — ABNORMAL LOW (ref 135–145)
Total Bilirubin: 0.5 mg/dL (ref 0.3–1.2)
Total Protein: 8.2 g/dL — ABNORMAL HIGH (ref 6.5–8.1)

## 2022-02-09 LAB — URINALYSIS, ROUTINE W REFLEX MICROSCOPIC
Bilirubin Urine: NEGATIVE
Glucose, UA: NEGATIVE mg/dL
Hgb urine dipstick: NEGATIVE
Ketones, ur: NEGATIVE mg/dL
Leukocytes,Ua: NEGATIVE
Nitrite: NEGATIVE
Protein, ur: NEGATIVE mg/dL
Specific Gravity, Urine: 1.027 (ref 1.005–1.030)
pH: 6 (ref 5.0–8.0)

## 2022-02-09 LAB — CBC WITH DIFFERENTIAL/PLATELET
Abs Immature Granulocytes: 0.05 10*3/uL (ref 0.00–0.07)
Basophils Absolute: 0.1 10*3/uL (ref 0.0–0.1)
Basophils Relative: 1 %
Eosinophils Absolute: 0.5 10*3/uL (ref 0.0–0.5)
Eosinophils Relative: 5 %
HCT: 36.8 % (ref 36.0–46.0)
Hemoglobin: 11.7 g/dL — ABNORMAL LOW (ref 12.0–15.0)
Immature Granulocytes: 1 %
Lymphocytes Relative: 29 %
Lymphs Abs: 3.1 10*3/uL (ref 0.7–4.0)
MCH: 23.1 pg — ABNORMAL LOW (ref 26.0–34.0)
MCHC: 31.8 g/dL (ref 30.0–36.0)
MCV: 72.7 fL — ABNORMAL LOW (ref 80.0–100.0)
Monocytes Absolute: 0.8 10*3/uL (ref 0.1–1.0)
Monocytes Relative: 8 %
Neutro Abs: 6 10*3/uL (ref 1.7–7.7)
Neutrophils Relative %: 56 %
Platelets: 267 10*3/uL (ref 150–400)
RBC: 5.06 MIL/uL (ref 3.87–5.11)
RDW: 18.6 % — ABNORMAL HIGH (ref 11.5–15.5)
WBC: 10.5 10*3/uL (ref 4.0–10.5)
nRBC: 0 % (ref 0.0–0.2)

## 2022-02-09 LAB — POC URINE PREG, ED: Preg Test, Ur: POSITIVE — AB

## 2022-02-09 NOTE — ED Triage Notes (Signed)
  Patient comes in with lower abdominal cramping and N/V that has been going on this week.  Patient states she took a home pregnancy test after missing her period and it was positive.  Denies any vaginal bleeding.  No urinary symptoms.  Had one episode of emesis earlier in the week.  No OTC medications.  Pain 8/10, cramping.

## 2022-02-10 ENCOUNTER — Emergency Department
Admission: EM | Admit: 2022-02-10 | Discharge: 2022-02-10 | Disposition: A | Discharge status: Home / Self Care | Payer: Medicaid Other | Source: Home / Self Care | Attending: Emergency Medicine | Admitting: Emergency Medicine

## 2022-02-10 ENCOUNTER — Emergency Department: Payer: Medicaid Other

## 2022-02-10 ENCOUNTER — Emergency Department
Admission: EM | Admit: 2022-02-10 | Discharge: 2022-02-10 | Discharge status: Against Medical Advice | Payer: Medicaid Other | Attending: Emergency Medicine | Admitting: Emergency Medicine

## 2022-02-10 ENCOUNTER — Encounter: Payer: Self-pay | Admitting: Emergency Medicine

## 2022-02-10 ENCOUNTER — Other Ambulatory Visit: Payer: Self-pay

## 2022-02-10 DIAGNOSIS — O26891 Other specified pregnancy related conditions, first trimester: Secondary | ICD-10-CM | POA: Diagnosis not present

## 2022-02-10 DIAGNOSIS — R102 Pelvic and perineal pain: Secondary | ICD-10-CM | POA: Diagnosis not present

## 2022-02-10 DIAGNOSIS — Z3201 Encounter for pregnancy test, result positive: Secondary | ICD-10-CM

## 2022-02-10 DIAGNOSIS — Z3A01 Less than 8 weeks gestation of pregnancy: Secondary | ICD-10-CM | POA: Diagnosis not present

## 2022-02-10 LAB — HCG, QUANTITATIVE, PREGNANCY: hCG, Beta Chain, Quant, S: 123505 m[IU]/mL — ABNORMAL HIGH (ref <5)

## 2022-02-10 NOTE — ED Notes (Signed)
Called for repeat VS x1, no answer

## 2022-02-10 NOTE — ED Notes (Signed)
Pt called for room x 3, no answer, not visualized in the lobby.

## 2022-02-10 NOTE — Discharge Instructions (Addendum)
Please establish care with your OB/GYN.  If you do not have an OB/GYN, please reach out to Dr. Thomasene Mohair to establish care. Please start taking prenatal vitamins.

## 2022-02-10 NOTE — ED Provider Triage Note (Signed)
Emergency Medicine Provider Triage Evaluation Note  Laura Higgins, a 21 y.o. G1P0 female  was evaluated in triage.  Pt complains of pelvic pain with positive home pregnancy test last week. She denies vaginal bleeding or discharge. She LWBS yesterday due to the protracted wait.   Review of Systems  Positive: Pelvic pain Negative: FCS  Physical Exam  BP 113/74 (BP Location: Left Arm)   Pulse 86   Temp 98 F (36.7 C) (Oral)   Resp 20   Ht 5\' 1"  (1.549 m)   Wt 83.9 kg   LMP 12/25/2021   SpO2 95%   BMI 34.95 kg/m  Gen:   Awake, no distress  NAD Resp:  Normal effort  MSK:   Moves extremities without difficulty  Other:  Soft, nontender  Medical Decision Making  Medically screening exam initiated at 4:45 PM.  Appropriate orders placed.  Laura Higgins was informed that the remainder of the evaluation will be completed by another provider, this initial triage assessment does not replace that evaluation, and the importance of remaining in the ED until their evaluation is complete.  Female patient with recent positive HPT returns to the ED for evaluation of pelvic pain in new pregnancy.   Garret Reddish, PA-C 02/10/22 1648

## 2022-02-10 NOTE — ED Notes (Signed)
Called for repeat VS, no answer

## 2022-02-10 NOTE — ED Triage Notes (Signed)
Pt reports was here yesterday and labs done but she left because the wait was too long. Pt reports is back today because she saw the results in her mychart and it said something about an ultrasound. Pt reports is here for an [place].

## 2022-02-10 NOTE — ED Provider Notes (Signed)
[facility] Provider Note  Patient Contact: 6:34 PM (approximate)   History   Pregnancy Ultrasound   HPI  Laura Higgins is a 21 y.o. female G1, P0, presents to the emergency department for concern for possible pregnancy.  Patient is unsure how long she is.  She denies vaginal bleeding, pelvic pain or abdominal pain.  She denies fever, chills, chest pain or chest tightness.      Physical Exam   Triage Vital Signs: ED Triage Vitals  Enc Vitals Group     BP 02/10/22 1644 113/74     Pulse Rate 02/10/22 1644 86     Resp 02/10/22 1644 20     Temp 02/10/22 1644 98 F (36.7 C)     Temp Source 02/10/22 1644 Oral     SpO2 02/10/22 1644 95 %     Weight 02/10/22 1548 184 lb 15.5 oz (83.9 kg)     Height 02/10/22 1548 5\' 1"  (1.549 m)     Head Circumference --      Peak Flow --      Pain Score 02/10/22 1548 0     Pain Loc --      Pain Edu? --      Excl. in GC? --     Most recent vital signs: Vitals:   02/10/22 1644  BP: 113/74  Pulse: 86  Resp: 20  Temp: 98 F (36.7 C)  SpO2: 95%     General: Alert and in no acute distress. Eyes:  PERRL. EOMI. Head: No acute traumatic findings ENT:      Nose: No congestion/rhinnorhea.      Mouth/Throat: Mucous membranes are moist. Neck: No stridor. No cervical spine tenderness to palpation. Cardiovascular:  Good peripheral perfusion Respiratory: Normal respiratory effort without tachypnea or retractions. Lungs CTAB. Good air entry to the bases with no decreased or absent breath sounds. Gastrointestinal: Bowel sounds 4 quadrants. Soft and nontender to palpation. No guarding or rigidity. No palpable masses. No distention. No CVA tenderness. Musculoskeletal: Full range of motion to all extremities.  Neurologic:  No gross focal neurologic deficits are appreciated.  Skin:   No rash noted Other:   ED Results / Procedures / Treatments   Labs (all labs ordered are listed, but only abnormal results are  displayed) Labs Reviewed - No data to display      RADIOLOGY  I personally viewed and evaluated these images as part of my medical decision making, as well as reviewing the written report by the radiologist.  ED Provider Interpretation: Single viable intrauterine pregnancy at 6 weeks 5 days.  Fetal heart rate 125 bpm   PROCEDURES:  Critical Care performed: No  Procedures   MEDICATIONS ORDERED IN ED: Medications - No data to display   IMPRESSION / MDM / ASSESSMENT AND PLAN / ED COURSE  I reviewed the triage vital signs and the nursing notes.                              Assessment and plan:  Pregnancy 21 year old female, G1, P0 presents to the emergency department with concern for possible pregnancy.  Urine pregnancy test was positive last night.  I reviewed urinalysis and patient had no signs of asymptomatic bacteriuria in pregnancy.  CBC, CMP were reassuring.  Beta-hCG was appropriately elevated.  Patient has no vaginal bleeding that would warrant ABO status.  Dedicated OB ultrasound shows single viable intrauterine pregnancy at 6  weeks 5 days with a fetal heart rate of 125 bpm.  Results were communicated to patient and patient was advised to follow-up with OB/GYN.   FINAL CLINICAL IMPRESSION(S) / ED DIAGNOSES   Final diagnoses:  Positive pregnancy test     Rx / DC Orders   ED Discharge Orders     None        Note:  This document was prepared using Dragon voice recognition software and may include unintentional dictation errors.   Pia Mau Lynn, PA-C 02/10/22 1844    Pilar Jarvis, MD 02/10/22 [phone]

## 2022-03-13 ENCOUNTER — Ambulatory Visit: Payer: Medicaid Other | Admitting: Advanced Practice Midwife

## 2022-03-13 DIAGNOSIS — E669 Obesity, unspecified: Secondary | ICD-10-CM

## 2022-03-13 DIAGNOSIS — F1290 Cannabis use, unspecified, uncomplicated: Secondary | ICD-10-CM

## 2022-03-13 DIAGNOSIS — O0990 Supervision of high risk pregnancy, unspecified, unspecified trimester: Secondary | ICD-10-CM | POA: Insufficient documentation

## 2022-03-13 DIAGNOSIS — F17200 Nicotine dependence, unspecified, uncomplicated: Secondary | ICD-10-CM

## 2022-03-13 DIAGNOSIS — Z7289 Other problems related to lifestyle: Secondary | ICD-10-CM | POA: Insufficient documentation

## 2022-03-13 DIAGNOSIS — O99211 Obesity complicating pregnancy, first trimester: Secondary | ICD-10-CM

## 2022-03-13 DIAGNOSIS — Z3401 Encounter for supervision of normal first pregnancy, first trimester: Secondary | ICD-10-CM

## 2022-03-13 LAB — WET PREP FOR TRICH, YEAST, CLUE
Trichomonas Exam: NEGATIVE
Yeast Exam: NEGATIVE

## 2022-03-13 LAB — HEMOGLOBIN, FINGERSTICK: Hemoglobin: 12.1 g/dL (ref 11.1–15.9)

## 2022-03-13 NOTE — Progress Notes (Signed)
Patient here for MH RV at [redacted]w[redacted]d.   Wet mount = negative (no treatment indicated) Hgb reviewed = no treatment indicated.  [facility] Anat [place] faxed with confirmation.   Patient is congested today and runny nose. COVID test preformed during clinic, results = negative.   PNV Rx handed to patient from provider and copy made to file for scanning.   Walked patient to [facility] so she can sign up as she has food insecurities.   Al Decant, RN

## 2022-03-13 NOTE — Progress Notes (Signed)
[facility]  Maternal Health Clinic   INITIAL PRENATAL VISIT NOTE  Subjective:  Laura Higgins is a 21 y.o. SBF exvaper G1P0000 at [redacted]w[redacted]d being seen today to start prenatal care at the [facility].  She feels "still in shock" about surprise pregnancy with no birth control. 21 yo employed FOB feels "he wanted me to have an abortion and will not be in the picture and wants a DNA test"; he has no children and they only had sex twice (once in July and once in August); he is living with his mom and little sister.  She is working 40 hrs/wk and living alone. LMP 12/25/21? Has been to ER x1 this pregnancy on 02/10/22 with u/s  at 6 5/7 that day. Last cig 2018. Last vaped 02/10/22. Last cigar 01/22/22. Last MJ 12/2021. Last ETOH 01/22/22 (7 shots Henessey) qo weekend. C/o stuffy/runny nose with sneezing onset 2 days ago. Last dental exam age 5.   Cutter age 78. She is currently monitored for the following issues for this low-risk pregnancy and has Obesity BMI=36.7; Smoker Black & Milds 2/day; Marijuana use daily; Trichomonas infection; and Encounter for supervision of normal first pregnancy in first trimester on their problem list.  Patient reports stuffy/runny nose, sneezing x 2 days.  Contractions: Not present. Vag. Bleeding: None.  Movement: Absent. Denies leaking of fluid.   Indications for ASA therapy (per uptodate) One of the following: Previous pregnancy with preeclampsia, especially early onset and with an adverse outcome No Multifetal gestation No Chronic hypertension No Type 1 or 2 diabetes mellitus No Chronic kidney disease No Autoimmune disease (antiphospholipid syndrome, systemic lupus erythematosus) No  Two or more of the following: Nulliparity Yes Obesity (body mass index >30 kg/m2) Yes Family history of preeclampsia in mother or sister No Age ?35 years No Sociodemographic characteristics (African American race, low socioeconomic level) Yes Personal  risk factors (eg, previous pregnancy with low birth weight or small for gestational age infant, previous adverse pregnancy outcome [eg, stillbirth], interval >10 years between pregnancies) No   The following portions of the patient's history were reviewed and updated as appropriate: allergies, current medications, past family history, past medical history, past social history, past surgical history and problem list. Problem list updated.  Objective:   Vitals:   03/13/22 0832  BP: 114/76  Pulse: (!) 105  Temp: 97.7 F (36.5 C)  Weight: 203 lb 12.8 oz (92.4 kg)    Fetal Status: Fetal Heart Rate (bpm): 160 Fundal Height: 12 cm Movement: Absent  Presentation: Undeterminable   Physical Exam Vitals and nursing note reviewed.  Constitutional:      General: She is not in acute distress.    Appearance: Normal appearance. She is well-developed. She is obese.  HENT:     Head: Normocephalic and atraumatic.     Right Ear: External ear normal.     Left Ear: External ear normal.     Nose: Nose normal. No congestion or rhinorrhea.     Mouth/Throat:     Lips: Pink.     Mouth: Mucous membranes are moist.     Dentition: Normal dentition. No dental caries.     Pharynx: Oropharynx is clear. Uvula midline.     Comments: Dentition: last dental exam age 57; fair dentition Eyes:     General: No scleral icterus.    Conjunctiva/sclera: Conjunctivae normal.  Neck:     Thyroid: No thyroid mass, thyromegaly or thyroid tenderness.  Cardiovascular:  Rate and Rhythm: Normal rate.     Pulses: Normal pulses.     Comments: Extremities are warm and well perfused Pulmonary:     Effort: Pulmonary effort is normal.     Breath sounds: Normal breath sounds.  Chest:     Chest wall: No mass.  Breasts:    Tanner Score is 5.     Breasts are symmetrical.     Right: Normal. No mass, nipple discharge or skin change.     Left: Normal. No mass, nipple discharge or skin change.  Abdominal:     Palpations:  Abdomen is soft.     Tenderness: There is no abdominal tenderness.     Comments: Gravid, soft without masses or tenderness, poor tone, FH=12 cm, FHR=160  Genitourinary:    General: Normal vulva.     Exam position: Lithotomy position.     Pubic Area: No rash.      Labia:        Right: No rash.        Left: No rash.      Vagina: Vaginal discharge (white creamy moderate leukorrhea, ph<4.5) present.     Cervix: Friability (friable to pap) present. No cervical motion tenderness.     Uterus: Normal. Enlarged (Gravid 12 wks size). Not tender.      Rectum: Normal. No external hemorrhoid.     Comments: Pap done Musculoskeletal:     Right lower leg: No edema.     Left lower leg: No edema.  Lymphadenopathy:     Cervical: No cervical adenopathy.     Upper Body:     Right upper body: No axillary adenopathy.     Left upper body: No axillary adenopathy.  Skin:    General: Skin is warm.     Capillary Refill: Capillary refill takes less than 2 seconds.  Neurological:     Mental Status: She is alert.    Assessment and Plan:  Pregnancy: G1P0000 at [redacted]w[redacted]d  1. Encounter for supervision of normal first pregnancy in first trimester Desires NIPS today Needs 1 hour glucola today Please assist pt with performing covid test today Please give pt dental list and encourage exam asap  - [id number] PLUS Core - Prenatal Profile I - Glucose, 1 hour gestational - Hgb A1c w/o eAG - Comprehensive metabolic panel - Protein / creatinine ratio, urine  (Spot) - TSH - Hemoglobinopathy evaluation -[id number] - [id number] Drug Screen - Pap IG (Image Guided)  2. Class 2 obesity with body mass index (BMI) of 35.0 to 35.9 in adult, unspecified obesity type, unspecified whether serious comorbidity present Agrees to ASA 81 mg daily weeks 12-36 Counseled on weight gain of 11-20 lbs this pregnancy  3. Marijuana use daily Counseled not to use Agrees to UDS  4. Smoker Black & Milds 2/day Counseled via 5 A's to stop      Discussed overview of care and coordination with inpatient delivery practices including [facility], [facility], [facility] and [facility].   Reviewed Centering pregnancy as standard of care at [facility]    Preterm labor symptoms and general obstetric precautions including but not limited to vaginal bleeding, contractions, leaking of fluid and fetal movement were reviewed in detail with the patient.  Please refer to After Visit Summary for other counseling recommendations.   No follow-ups on file.  No future appointments.  Herbie Saxon, CNM

## 2022-03-14 LAB — PROTEIN / CREATININE RATIO, URINE
Creatinine, Urine: 290.2 mg/dL
Protein, Ur: 39.7 mg/dL
Protein/Creat Ratio: 137 mg/g creat (ref 0–200)

## 2022-03-14 LAB — 789231 7+OXYCODONE-BUND
Amphetamines, Urine: NEGATIVE ng/mL
BENZODIAZ UR QL: NEGATIVE ng/mL
Barbiturate screen, urine: NEGATIVE ng/mL
Cannabinoid Quant, Ur: NEGATIVE ng/mL
Cocaine (Metab.): NEGATIVE ng/mL
OPIATE SCREEN URINE: NEGATIVE ng/mL
Oxycodone/Oxymorphone, Urine: NEGATIVE ng/mL
PCP Quant, Ur: NEGATIVE ng/mL

## 2022-03-15 ENCOUNTER — Telehealth: Payer: Self-pay

## 2022-03-15 NOTE — Telephone Encounter (Signed)
Reactive syphilis report brought to clinic by Clance Boll RN, Nursing Supervisor as notification received via [place] communicable disease reporting system (result not yet received electronically from [employer]). Call to client and OB problem visit scheduled for 03/18/2022 with arrival time of 0820. Rich Number, RN

## 2022-03-16 LAB — PAP IG (IMAGE GUIDED): PAP Smear Comment: 0

## 2022-03-17 LAB — PREGNANCY, INITIAL SCREEN
Antibody Screen: NEGATIVE
Basophils Absolute: 0.1 10*3/uL (ref 0.0–0.2)
Basos: 1 %
Bilirubin, UA: NEGATIVE
Chlamydia trachomatis, NAA: NEGATIVE
EOS (ABSOLUTE): 0.9 10*3/uL — ABNORMAL HIGH (ref 0.0–0.4)
Eos: 8 %
Glucose, UA: NEGATIVE
HCV Ab: NONREACTIVE
HIV Screen 4th Generation wRfx: NONREACTIVE
Hematocrit: 37.6 % (ref 34.0–46.6)
Hemoglobin: 12.1 g/dL (ref 11.1–15.9)
Hepatitis B Surface Ag: NEGATIVE
Immature Grans (Abs): 0 10*3/uL (ref 0.0–0.1)
Immature Granulocytes: 0 %
Ketones, UA: NEGATIVE
Lymphocytes Absolute: 1.7 10*3/uL (ref 0.7–3.1)
Lymphs: 16 %
MCH: 23.9 pg — ABNORMAL LOW (ref 26.6–33.0)
MCHC: 32.2 g/dL (ref 31.5–35.7)
MCV: 74 fL — ABNORMAL LOW (ref 79–97)
Monocytes Absolute: 0.9 10*3/uL (ref 0.1–0.9)
Monocytes: 8 %
Neisseria Gonorrhoeae by PCR: NEGATIVE
Neutrophils Absolute: 7.4 10*3/uL — ABNORMAL HIGH (ref 1.4–7.0)
Neutrophils: 67 %
Nitrite, UA: POSITIVE — AB
Platelets: 208 10*3/uL (ref 150–450)
RBC, UA: NEGATIVE
RBC: 5.07 x10E6/uL (ref 3.77–5.28)
RDW: 19.3 % — ABNORMAL HIGH (ref 11.7–15.4)
RPR Ser Ql: REACTIVE — AB
Rh Factor: POSITIVE
Rubella Antibodies, IGG: <0.9 index — ABNORMAL LOW (ref >0.99)
Specific Gravity, UA: >=1.03 — AB (ref 1.005–1.030)
Urobilinogen, Ur: 1 mg/dL (ref 0.2–1.0)
WBC: 11 10*3/uL — ABNORMAL HIGH (ref 3.4–10.8)
pH, UA: 6 (ref 5.0–7.5)

## 2022-03-17 LAB — COMPREHENSIVE METABOLIC PANEL
ALT: 52 IU/L — ABNORMAL HIGH (ref 0–32)
AST: 25 IU/L (ref 0–40)
Albumin/Globulin Ratio: 1.4 (ref 1.2–2.2)
Albumin: 4.3 g/dL (ref 4.0–5.0)
Alkaline Phosphatase: 96 IU/L (ref 44–121)
BUN/Creatinine Ratio: 13 (ref 9–23)
BUN: 8 mg/dL (ref 6–20)
Bilirubin Total: <0.2 mg/dL (ref 0.0–1.2)
CO2: 18 mmol/L — ABNORMAL LOW (ref 20–29)
Calcium: 9.3 mg/dL (ref 8.7–10.2)
Chloride: 102 mmol/L (ref 96–106)
Creatinine, Ser: 0.61 mg/dL (ref 0.57–1.00)
Globulin, Total: 3 g/dL (ref 1.5–4.5)
Glucose: 113 mg/dL — ABNORMAL HIGH (ref 70–99)
Potassium: 4.3 mmol/L (ref 3.5–5.2)
Sodium: 138 mmol/L (ref 134–144)
Total Protein: 7.3 g/dL (ref 6.0–8.5)
eGFR: 129 mL/min/{1.73_m2} (ref >59)

## 2022-03-17 LAB — URINE CULTURE, OB REFLEX

## 2022-03-17 LAB — HGB FRACTIONATION CASCADE: Hgb A2: 1.6 % — ABNORMAL LOW (ref 1.8–3.2)

## 2022-03-17 LAB — MATERNIT 21 PLUS CORE, BLOOD
Fetal Fraction: 5
Result (T21): NEGATIVE
Trisomy 13 (Patau syndrome): NEGATIVE
Trisomy 18 (Edwards syndrome): NEGATIVE
Trisomy 21 (Down syndrome): NEGATIVE

## 2022-03-17 LAB — HGB A1C W/O EAG: Hgb A1c MFr Bld: 4.5 % — ABNORMAL LOW (ref 4.8–5.6)

## 2022-03-17 LAB — TSH: TSH: 1.49 u[IU]/mL (ref 0.450–4.500)

## 2022-03-17 LAB — HGB FRACTIONATION BY HPLC
Hgb A: 73.5 % — ABNORMAL LOW (ref 96.4–98.8)
Hgb C: 0 %
Hgb E: 0 %
Hgb F: 24.9 % — ABNORMAL HIGH (ref 0.0–2.0)
Hgb S: 0 %
Hgb Variant: 0 %

## 2022-03-17 LAB — MICROSCOPIC EXAMINATION
Casts: NONE SEEN /lpf
Epithelial Cells (non renal): >10 /hpf — AB (ref 0–10)

## 2022-03-17 LAB — RPR, QUANT+TP ABS (REFLEX)
Rapid Plasma Reagin, Quant: 1:64 {titer} — ABNORMAL HIGH
T Pallidum Abs: REACTIVE — AB

## 2022-03-17 LAB — GLUCOSE, 1 HOUR GESTATIONAL: Gestational Diabetes Screen: 116 mg/dL (ref 70–139)

## 2022-03-17 LAB — HCV INTERPRETATION

## 2022-03-18 ENCOUNTER — Ambulatory Visit: Payer: Medicaid Other | Admitting: Advanced Practice Midwife

## 2022-03-18 ENCOUNTER — Other Ambulatory Visit: Payer: Self-pay | Admitting: Advanced Practice Midwife

## 2022-03-18 ENCOUNTER — Encounter: Payer: Self-pay | Admitting: Advanced Practice Midwife

## 2022-03-18 ENCOUNTER — Telehealth: Payer: Self-pay

## 2022-03-18 VITALS — BP 114/72 | HR 108 | Temp 97.2°F | Wt 204.8 lb

## 2022-03-18 DIAGNOSIS — O98111 Syphilis complicating pregnancy, first trimester: Secondary | ICD-10-CM | POA: Diagnosis not present

## 2022-03-18 DIAGNOSIS — O2340 Unspecified infection of urinary tract in pregnancy, unspecified trimester: Secondary | ICD-10-CM | POA: Insufficient documentation

## 2022-03-18 DIAGNOSIS — A599 Trichomoniasis, unspecified: Secondary | ICD-10-CM

## 2022-03-18 DIAGNOSIS — Z3401 Encounter for supervision of normal first pregnancy, first trimester: Secondary | ICD-10-CM

## 2022-03-18 DIAGNOSIS — O09891 Supervision of other high risk pregnancies, first trimester: Secondary | ICD-10-CM | POA: Insufficient documentation

## 2022-03-18 DIAGNOSIS — Z2839 Other underimmunization status: Secondary | ICD-10-CM | POA: Insufficient documentation

## 2022-03-18 DIAGNOSIS — R799 Abnormal finding of blood chemistry, unspecified: Secondary | ICD-10-CM | POA: Insufficient documentation

## 2022-03-18 DIAGNOSIS — O98119 Syphilis complicating pregnancy, unspecified trimester: Secondary | ICD-10-CM

## 2022-03-18 DIAGNOSIS — O09892 Supervision of other high risk pregnancies, second trimester: Secondary | ICD-10-CM | POA: Insufficient documentation

## 2022-03-18 MED ORDER — PENICILLIN G BENZATHINE 1200000 UNIT/2ML IM SUSY
2.4000 10*6.[IU] | PREFILLED_SYRINGE | INTRAMUSCULAR | Status: AC
  Administered 2022-03-18 – 2022-04-01 (×3): 2.4 10*6.[IU] via INTRAMUSCULAR

## 2022-03-18 MED ORDER — NITROFURANTOIN MONOHYD MACRO 100 MG PO CAPS: 100.0000 mg | ORAL_CAPSULE | Freq: Two times a day (BID) | ORAL | 0 refills | Status: DC

## 2022-03-18 NOTE — Progress Notes (Signed)
[place] [facility]  PRENATAL VISIT NOTE  Subjective:  Laura Higgins is a 21 y.o. G1P0000 at [redacted]w[redacted]d being seen today for ongoing prenatal care.  She is currently monitored for the following issues for this low-risk pregnancy and has Obesity BMI=36.7; Smoker Black & Milds 2/day; Marijuana use daily; Trichomonas infection 03/13/22; Encounter for supervision of normal first pregnancy in first trimester; cutter age 26; and Syphilis affecting pregnancy 03/13/22 RPR 1:64 on their problem list.  Patient reports no complaints.  Contractions: Not present. Vag. Bleeding: None.  Movement: Absent. Denies leaking of fluid/ROM.   The following portions of the patient's history were reviewed and updated as appropriate: allergies, current medications, past family history, past medical history, past social history, past surgical history and problem list. Problem list updated.  Objective:   Vitals:   03/18/22 0829  BP: 114/72  Pulse: (!) 108  Temp: (!) 97.2 F (36.2 C)  Weight: 204 lb 12.8 oz (92.9 kg)    Fetal Status: Fetal Heart Rate (bpm): 160 Fundal Height: 12 cm Movement: Absent     General:  Alert, oriented and cooperative. Patient is in no acute distress.  Skin: Skin is warm and dry. No rash noted.   Cardiovascular: Normal heart rate noted  Respiratory: Normal respiratory effort, no problems with respiration noted  Abdomen: Soft, gravid, appropriate for gestational age.  Pain/Pressure: Absent     Pelvic: Cervical exam deferred        Extremities: Normal range of motion.  Edema: None  Mental Status: Normal mood and affect. Normal behavior. Normal judgment and thought content.   Assessment and Plan:  Pregnancy: G1P0000 at [redacted]w[redacted]d  1. Syphilis affecting pregnancy, antepartum Needs treatment x3 with Bicillin IM per standing orders  2. Encounter for supervision of normal first pregnancy in first trimester Next apt 04/10/22 Pap 03/13/22 neg NIPS 03/13/22  neg Hasn't bought ASA 81 mg yet--plans to buy today and begin taking tomorrow  3. Trichomonas infection 03/13/22 Treated 03/13/22   Preterm labor symptoms and general obstetric precautions including but not limited to vaginal bleeding, contractions, leaking of fluid and fetal movement were reviewed in detail with the patient. Please refer to After Visit Summary for other counseling recommendations.  No follow-ups on file.  Future Appointments  Date Time Provider [place]  04/10/2022  8:20 AM [facility] PROVIDER [facility] None    Herbie Saxon, CNM

## 2022-03-18 NOTE — Telephone Encounter (Signed)
Call to client who has UTI and needs treatment which has been e-prescribed by E. Sciora CNM. Client counseled to pick up medication tonight or tomorrow am and take per bottle directions. Client states understanding. Rich Number, RN

## 2022-03-18 NOTE — Progress Notes (Signed)
Verified has [facility] contact card. Aware of next [facility] RV appt 04/10/2022 with arrival time of 0800. Rich Number, RN Bicillin administered per verbal order of E. Sciora CNM and client tolerated with minimal complaints of discomfort. Counseled to remain in exam room x15 minutes for observation and client complied with request. Appts for subsequent treatments scheduled for 03/25/22 and 04/01/22 with reminder given to client. Rich Number, RN CD report form completed and placed in wall box in Nurse Clinic workroom. Rich Number, RN

## 2022-03-21 ENCOUNTER — Ambulatory Visit: Payer: Medicaid Other

## 2022-03-25 ENCOUNTER — Other Ambulatory Visit: Payer: Medicaid Other

## 2022-03-25 DIAGNOSIS — O98119 Syphilis complicating pregnancy, unspecified trimester: Secondary | ICD-10-CM

## 2022-03-25 NOTE — Progress Notes (Signed)
In nurse clinic for syphilis tx, Bicillin #2. Voices no problems with injections last week.   Pt states she is taking med for UTI (Macrobid) as prescribed and has one pill remaining.   Bicillin 2.4 mu given IM today without problem. Order by Ola Spurr, CNM dated 03/18/2022. Pt stays for 20 min observation without problem. Next appt (Bicillin #3) scheduled 04/01/22, pt aware. Josie Saunders, RN

## 2022-03-26 ENCOUNTER — Encounter: Payer: Self-pay | Admitting: Family Medicine

## 2022-03-27 ENCOUNTER — Telehealth: Payer: Self-pay

## 2022-03-27 NOTE — Telephone Encounter (Signed)
Per Ola Spurr CNM result note, call made to client to notify her [facility] will be calling her with genetic counseling appt and to verify picked up [place] for UTI treatment earlier this month ([facility] pharmacy closed when call attempted prior to client call). Call went through and rang numerous times - no answer, no voicemail. Rich Number, RN

## 2022-03-27 NOTE — Telephone Encounter (Signed)
Return call from client who states has taken all of her UTI antibiotic. Counseled that genetic counseling appt has been ordered for her at [facility] and they will be calling her with the appt. Rich Number, RN

## 2022-03-27 NOTE — Telephone Encounter (Signed)
[facility] on [address], Nitrofurantoin was e-prescribed on 03/18/2022 and picked up by client on 03/19/2022. Rich Number, RN

## 2022-03-27 NOTE — Telephone Encounter (Signed)
[facility] on [address] client picked up prescription on 03/19/2022 (e-prescribed 03/18/2022). Rich Number, RN

## 2022-03-27 NOTE — Telephone Encounter (Signed)
Call to client on 03/27/2022. Rich Number, RN

## 2022-03-28 NOTE — Addendum Note (Signed)
Addended by: Cletis Media on: 03/28/2022 02:19 PM   Modules accepted: Orders

## 2022-03-29 DIAGNOSIS — Z315 Encounter for genetic counseling: Secondary | ICD-10-CM | POA: Diagnosis not present

## 2022-04-01 ENCOUNTER — Other Ambulatory Visit: Payer: Medicaid Other

## 2022-04-01 DIAGNOSIS — O98119 Syphilis complicating pregnancy, unspecified trimester: Secondary | ICD-10-CM

## 2022-04-01 NOTE — Progress Notes (Signed)
In nurse clinic for syphilis tx / Bicillin #3. Voices no problems from last week's bicillin.   Bicillin 2.72mu IM given today per order by Ola Spurr, CNM dated 03/18/2022. Tolerated well. Stayed for 15 min observation without problem. Next [facility] RV appt 04/10/2022, pt aware. Josie Saunders, RN

## 2022-04-05 ENCOUNTER — Telehealth: Payer: Self-pay | Admitting: Family Medicine

## 2022-04-05 NOTE — Telephone Encounter (Signed)
Call to client and [facility] RV appt scheduled for 04/16/2022 with arrival time of 1300. Rich Number, RN

## 2022-04-05 NOTE — Telephone Encounter (Signed)
PLEASE CALL ME ABOUT MY APPT TIME AND DAY I HAVE COURT ON NOV 1 AT THE SAME TIME AS MY APPT I NEED TO RESCHEDULE MY APPT TO ANOTHER DATE

## 2022-04-10 ENCOUNTER — Ambulatory Visit: Payer: Medicaid Other

## 2022-04-16 ENCOUNTER — Ambulatory Visit: Payer: Medicaid Other | Admitting: Advanced Practice Midwife

## 2022-04-16 VITALS — BP 108/70 | HR 99 | Temp 97.2°F | Wt 205.8 lb

## 2022-04-16 DIAGNOSIS — O2342 Unspecified infection of urinary tract in pregnancy, second trimester: Secondary | ICD-10-CM | POA: Diagnosis not present

## 2022-04-16 DIAGNOSIS — O2341 Unspecified infection of urinary tract in pregnancy, first trimester: Secondary | ICD-10-CM | POA: Diagnosis not present

## 2022-04-16 DIAGNOSIS — Z6835 Body mass index (BMI) 35.0-35.9, adult: Secondary | ICD-10-CM

## 2022-04-16 DIAGNOSIS — O98112 Syphilis complicating pregnancy, second trimester: Secondary | ICD-10-CM

## 2022-04-16 DIAGNOSIS — F1290 Cannabis use, unspecified, uncomplicated: Secondary | ICD-10-CM

## 2022-04-16 DIAGNOSIS — O98119 Syphilis complicating pregnancy, unspecified trimester: Secondary | ICD-10-CM

## 2022-04-16 DIAGNOSIS — O0992 Supervision of high risk pregnancy, unspecified, second trimester: Secondary | ICD-10-CM | POA: Diagnosis not present

## 2022-04-16 DIAGNOSIS — E669 Obesity, unspecified: Secondary | ICD-10-CM

## 2022-04-16 DIAGNOSIS — E66812 Obesity, class 2: Secondary | ICD-10-CM

## 2022-04-16 DIAGNOSIS — F17200 Nicotine dependence, unspecified, uncomplicated: Secondary | ICD-10-CM

## 2022-04-16 DIAGNOSIS — O0990 Supervision of high risk pregnancy, unspecified, unspecified trimester: Secondary | ICD-10-CM | POA: Diagnosis not present

## 2022-04-16 NOTE — Progress Notes (Signed)
[place] [facility]  PRENATAL VISIT NOTE  Subjective:  Laura Higgins is a 21 y.o. G1P0000 at [redacted]w[redacted]d being seen today for ongoing prenatal care.  She is currently monitored for the following issues for this high-risk pregnancy and has Obesity BMI=36.7; Smoker Black & Milds 2/day; Marijuana use daily; Trichomonas infection 03/13/22; Supervision of high risk pregnancy, antepartum; cutter age 58; Syphilis affecting pregnancy 03/13/22 RPR 1:64; Rubella non-immune status, antepartum; UTI (urinary tract infection) during pregnancy 03/13/22 E. Coli; and Abnormal blood finding 03/13/22 Hgb fractionation (low HgbA2, elevated Hgb F) on their problem list.  Patient reports no complaints.  Contractions: Not present. Vag. Bleeding: None.  Movement: Absent. Denies leaking of fluid/ROM.   The following portions of the patient's history were reviewed and updated as appropriate: allergies, current medications, past family history, past medical history, past social history, past surgical history and problem list. Problem list updated.  Objective:   Vitals:   04/16/22 1324  BP: 108/70  Pulse: 99  Temp: (!) 97.2 F (36.2 C)  Weight: 205 lb 12.8 oz (93.4 kg)    Fetal Status: Fetal Heart Rate (bpm): 158 Fundal Height: 16 cm Movement: Absent     General:  Alert, oriented and cooperative. Patient is in no acute distress.  Skin: Skin is warm and dry. No rash noted.   Cardiovascular: Normal heart rate noted  Respiratory: Normal respiratory effort, no problems with respiration noted  Abdomen: Soft, gravid, appropriate for gestational age.  Pain/Pressure: Absent     Pelvic: Cervical exam deferred        Extremities: Normal range of motion.  Edema: None  Mental Status: Normal mood and affect. Normal behavior. Normal judgment and thought content.   Assessment and Plan:  Pregnancy: G1P0000 at [redacted]w[redacted]d  1. Urinary tract infection in mother during first trimester of pregnancy Dx'd  03/13/22 and treated on 03/19/22 C&S TOC today - Urine Culture  2. Supervision of high risk pregnancy, antepartum Living with her 50 yo brother and his 2 kids Working 59 hrs/wk Not exercising--encouraged to do so Reviewed 02/10/22 u/s at 6 5/7 Had genetic counseling 03/29/22 Has anatomy u/s 05/08/22 Declines AFP only NIPS 03/13/22=neg 1 hour glucose=116 on 03/13/22 - [id number] Drug Screen  3. Class 2 obesity with body mass index (BMI) of 35.0 to 35.9 in adult, unspecified obesity type, unspecified whether serious comorbidity present 21 lb 12.8 oz (9.888 kg) Taking ASA 81 mg daily  4. Marijuana use daily Denies use; agrees to UDS  5. Smoker Black & Milds 2/day Denies use any more  6. Syphilis affecting pregnancy, antepartum Received last and #3 tx on 04/01/22 Doesn't know if partner was treated yet   Preterm labor symptoms and general obstetric precautions including but not limited to vaginal bleeding, contractions, leaking of fluid and fetal movement were reviewed in detail with the patient. Please refer to After Visit Summary for other counseling recommendations.  No follow-ups on file.  No future appointments.  Herbie Saxon, CNM

## 2022-04-16 NOTE — Progress Notes (Signed)
Verified client has [facility] contact card. She kept 03/29/2022 [facility] genetic counseling appt and aware of 05/08/2022 [facility] [place] appt. Counseled regarding AFP only and declines test today. Pink sticky noted to offer at next [facility] RV appt. Counseled to call [facility] to ascertain if accepting new infants for pediatric care. Verified client has previously given Animal nutritionist. Client reports completed treated for UTI => urine TOC today. Rich Number, RN

## 2022-04-18 LAB — URINE CULTURE

## 2022-04-20 LAB — 789231 7+OXYCODONE-BUND
Amphetamines, Urine: NEGATIVE ng/mL
BENZODIAZ UR QL: NEGATIVE ng/mL
Barbiturate screen, urine: NEGATIVE ng/mL
Cocaine (Metab.): NEGATIVE ng/mL
OPIATE SCREEN URINE: NEGATIVE ng/mL
Oxycodone/Oxymorphone, Urine: NEGATIVE ng/mL
PCP Quant, Ur: NEGATIVE ng/mL

## 2022-04-20 LAB — CANNABINOID CONFIRMATION, UR
CANNABINOIDS: POSITIVE — AB
Carboxy THC GC/MS Conf: 31 ng/mL

## 2022-04-22 ENCOUNTER — Encounter: Payer: Self-pay | Admitting: Advanced Practice Midwife

## 2022-04-22 DIAGNOSIS — R825 Elevated urine levels of drugs, medicaments and biological substances: Secondary | ICD-10-CM | POA: Insufficient documentation

## 2022-05-08 ENCOUNTER — Telehealth: Payer: Self-pay | Admitting: Obstetrics and Gynecology

## 2022-05-08 NOTE — Telephone Encounter (Signed)
Patient is scheduled for follow up appointments

## 2022-05-08 NOTE — Telephone Encounter (Signed)
Pt called in to practice to schedule 2nd trimester [place].  Was not a pt here.  She is scheduled at [facility] on 12/5.  She can establish care with [place], but the first available NEW OB appt is going to be with Dr. Logan Bores on 12/22.  Encourage pt to keep [facility] appt, until we can get her in at [facility].  Could not leave a message bc mailbox was not set up.

## 2022-05-13 ENCOUNTER — Ambulatory Visit (INDEPENDENT_AMBULATORY_CARE_PROVIDER_SITE_OTHER): Payer: Medicaid Other

## 2022-05-13 ENCOUNTER — Ambulatory Visit: Payer: Medicaid Other

## 2022-05-13 DIAGNOSIS — O0990 Supervision of high risk pregnancy, unspecified, unspecified trimester: Secondary | ICD-10-CM

## 2022-05-13 DIAGNOSIS — Z3689 Encounter for other specified antenatal screening: Secondary | ICD-10-CM

## 2022-05-13 DIAGNOSIS — Z3480 Encounter for supervision of other normal pregnancy, unspecified trimester: Secondary | ICD-10-CM

## 2022-05-13 DIAGNOSIS — Z369 Encounter for antenatal screening, unspecified: Secondary | ICD-10-CM

## 2022-05-13 DIAGNOSIS — Z2839 Other underimmunization status: Secondary | ICD-10-CM

## 2022-05-13 NOTE — Progress Notes (Addendum)
New OB Intake  I connected with  Laura Higgins on 05/13/22 at 10:15 AM EST by telephone and verified that I am speaking with the correct person using two identifiers. Nurse is located at [facility] and pt is located at home.  Pt is transferring her prenatal care to [place].  I explained I am completing New OB Intake today. We discussed her EDD of 10/01/2022 that is based on LMP of 12/25/2021. Pt is G1/P0. I reviewed her allergies, medications, Medical/Surgical/OB history, and appropriate screenings. Based on history, this is a/an pregnancy uncomplicated .   Patient Active Problem List   Diagnosis Date Noted   Positive urine drug screen  04/16/22 +MJ 04/22/2022   Syphilis affecting pregnancy 03/13/22 RPR 1:64 03/18/2022   Rubella non-immune status, antepartum 03/18/2022   UTI (urinary tract infection) during pregnancy 03/13/22 E. Coli 03/18/2022   Abnormal blood finding 03/13/22 Hgb fractionation (low HgbA2, elevated Hgb F) 03/18/2022   Supervision of high risk pregnancy, antepartum 03/13/2022   cutter age 29 03/13/2022   Marijuana use daily 11/10/2019   Trichomonas infection 03/13/22 11/10/2019   Smoker Black & Milds 2/day 08/04/2019   Obesity BMI=36.7 06/13/2015    Concerns addressed today None  Delivery Plans:  Plans to deliver at [facility].  Anatomy US Explained first scheduled [place] will be around 20 weeks. Anatomy US will be scheduled soon as pt will be 20 weeks tomorrow.  Labs Genetic lab and NOB labs already drawn at another facility.  COVID Vaccine Patient has not had COVID vaccine.   Social Determinants of Health Food Insecurity: expresses food insecurity. Information given on local food banks. Transportation: Patient denies transportation needs.  First visit review I reviewed new OB appt with pt. I explained she will have ob bloodwork and pap smear/pelvic exam if indicated. Explained pt will be seen by Dr. Brennan Bailey at first visit; encounter routed to  appropriate provider.   Loran Senters, [facility] 05/13/2022  10:57 AM

## 2022-05-14 ENCOUNTER — Ambulatory Visit: Payer: Medicaid Other | Admitting: Nurse Practitioner

## 2022-05-14 DIAGNOSIS — Z3480 Encounter for supervision of other normal pregnancy, unspecified trimester: Secondary | ICD-10-CM

## 2022-05-14 NOTE — Progress Notes (Signed)
Patient arrived to [facility] for routine MHRV appointment. Upon looking into chart, RN noticed patient is seeing Laura Higgins. She verified with RN she kept their New OB appointment yesterday and she is aware she has a appointment for labs tomorrow. Patient states she plans to continue care with Laura Higgins and therefor did not continue this visit with patient today. Patient verbalized understanding.  Earlyne Iba, RN

## 2022-05-15 ENCOUNTER — Other Ambulatory Visit: Payer: Medicaid Other

## 2022-05-22 ENCOUNTER — Other Ambulatory Visit: Payer: Medicaid Other

## 2022-05-28 ENCOUNTER — Ambulatory Visit
Admission: RE | Admit: 2022-05-28 | Discharge: 2022-05-28 | Disposition: A | Discharge status: Home / Self Care | Payer: Medicaid Other | Source: Ambulatory Visit | Attending: Obstetrics and Gynecology | Admitting: Obstetrics and Gynecology

## 2022-05-28 DIAGNOSIS — Z3A21 21 weeks gestation of pregnancy: Secondary | ICD-10-CM | POA: Insufficient documentation

## 2022-05-28 DIAGNOSIS — Z3480 Encounter for supervision of other normal pregnancy, unspecified trimester: Secondary | ICD-10-CM | POA: Diagnosis present

## 2022-05-28 DIAGNOSIS — Z369 Encounter for antenatal screening, unspecified: Secondary | ICD-10-CM | POA: Insufficient documentation

## 2022-05-28 DIAGNOSIS — O321XX0 Maternal care for breech presentation, not applicable or unspecified: Secondary | ICD-10-CM | POA: Insufficient documentation

## 2022-06-04 ENCOUNTER — Ambulatory Visit (INDEPENDENT_AMBULATORY_CARE_PROVIDER_SITE_OTHER): Payer: Medicaid Other | Admitting: Obstetrics and Gynecology

## 2022-06-04 ENCOUNTER — Encounter: Payer: Self-pay | Admitting: Obstetrics and Gynecology

## 2022-06-04 VITALS — BP 120/79 | HR 114 | Wt 219.0 lb

## 2022-06-04 DIAGNOSIS — Z3402 Encounter for supervision of normal first pregnancy, second trimester: Secondary | ICD-10-CM

## 2022-06-04 DIAGNOSIS — Z3A23 23 weeks gestation of pregnancy: Secondary | ICD-10-CM

## 2022-06-04 DIAGNOSIS — Z131 Encounter for screening for diabetes mellitus: Secondary | ICD-10-CM

## 2022-06-04 DIAGNOSIS — Z113 Encounter for screening for infections with a predominantly sexual mode of transmission: Secondary | ICD-10-CM

## 2022-06-04 DIAGNOSIS — Z7689 Persons encountering health services in other specified circumstances: Secondary | ICD-10-CM

## 2022-06-04 LAB — POCT URINALYSIS DIPSTICK OB
Bilirubin, UA: NEGATIVE
Blood, UA: NEGATIVE
Glucose, UA: NEGATIVE
Ketones, UA: NEGATIVE
Leukocytes, UA: NEGATIVE
Nitrite, UA: NEGATIVE
POC,PROTEIN,UA: NEGATIVE
Spec Grav, UA: 1.01 (ref 1.010–1.025)
Urobilinogen, UA: 1 E.U./dL
pH, UA: 6 (ref 5.0–8.0)

## 2022-06-04 NOTE — Progress Notes (Signed)
ROB: 23 weeks-patient transfered herself from health department.  She is up-to-date on prenatal care.  She is taking prenatal vitamins as directed.  Reports daily fetal movement.  Pregnancy was earlier complicated by positive RPR.  Patient treated appropriately. Plan 1 hour GCT next visit.

## 2022-06-10 NOTE — L&D Delivery Note (Signed)
Date of delivery: 09/27/22 Estimated Date of Delivery: 10/01/22 EGA: [redacted]w[redacted]d  Hospital Course summary: Laura Higgins is a 23 y.o. G1P1001 @ [redacted]w[redacted]d who was admitted on 09/26/2022 for IOL for elevated BMI. She received two doses of cytotec, cooks catheter and pitocin. Steadily progressed to complete.  Delivery Note At 4:17 PM a viable female was delivered via Vaginal, Spontaneous (Presentation: Left Occiput Anterior).  APGAR: 8, 9; weight 6 lb 12.6 oz (3080 g).   Placenta status: Spontaneous, Intact.  Cord: 3 vessels with the following complications: None.   Anesthesia: Epidural Episiotomy: None Lacerations: None Suture Repair: NA Est. Blood Loss (mL): 250  Mom to postpartum.  Baby to Couplet care / Skin to Skin.  Raeford Razor 09/27/2022, 6:58 PM    Delivery Narrative  Laura Higgins was complete at 1500 and began pushing at 1500. She delivered a vigorous female infant named Laura Higgins at 56. Apgars 8,9. The head followed by shoulders which were delivered without difficulty, and the rest of the body. Baby was immediately placed skin to skin on mother's chest. No Nuchal cord was noted. Delayed cord clamping with cord clamped by CNM and cut by FOB. Cord blood was obtained. Placenta was delivered at 1626, primarily by maternal efforts and with some slight cord traction. Placenta was intact, Shultz mechanism, 3 VC. Perineum inspected and found to be intact. AMSTL with IV pitocin per unit protocol. EBL 200. Mother and baby stable.

## 2022-07-02 ENCOUNTER — Encounter: Payer: Self-pay | Admitting: Licensed Practical Nurse

## 2022-07-02 ENCOUNTER — Ambulatory Visit (INDEPENDENT_AMBULATORY_CARE_PROVIDER_SITE_OTHER): Payer: Medicaid Other | Admitting: Licensed Practical Nurse

## 2022-07-02 ENCOUNTER — Other Ambulatory Visit: Payer: Medicaid Other

## 2022-07-02 VITALS — BP 125/75 | HR 111 | Wt 217.2 lb

## 2022-07-02 DIAGNOSIS — O26842 Uterine size-date discrepancy, second trimester: Secondary | ICD-10-CM

## 2022-07-02 DIAGNOSIS — O0990 Supervision of high risk pregnancy, unspecified, unspecified trimester: Secondary | ICD-10-CM

## 2022-07-02 DIAGNOSIS — Z3402 Encounter for supervision of normal first pregnancy, second trimester: Secondary | ICD-10-CM

## 2022-07-02 DIAGNOSIS — O26843 Uterine size-date discrepancy, third trimester: Secondary | ICD-10-CM

## 2022-07-02 DIAGNOSIS — Z131 Encounter for screening for diabetes mellitus: Secondary | ICD-10-CM

## 2022-07-02 DIAGNOSIS — Z3A27 27 weeks gestation of pregnancy: Secondary | ICD-10-CM

## 2022-07-02 DIAGNOSIS — Z113 Encounter for screening for infections with a predominantly sexual mode of transmission: Secondary | ICD-10-CM

## 2022-07-02 LAB — POCT URINALYSIS DIPSTICK
Bilirubin, UA: NEGATIVE
Blood, UA: NEGATIVE
Glucose, UA: NEGATIVE
Ketones, UA: NEGATIVE
Leukocytes, UA: NEGATIVE
Nitrite, UA: NEGATIVE
Protein, UA: NEGATIVE
Spec Grav, UA: 1.015 (ref 1.010–1.025)
Urobilinogen, UA: 1 E.U./dL
pH, UA: 6.5 (ref 5.0–8.0)

## 2022-07-02 NOTE — Progress Notes (Signed)
Routine Prenatal Care Visit  Subjective  Laura Higgins is a 22 y.o. G1P0000 at [redacted]w[redacted]d being seen today for ongoing prenatal care.  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 Coli; Abnormal blood finding 03/13/22 Hgb fractionation (low HgbA2, elevated Hgb F); Positive urine drug screen  04/16/22 +MJ; and Supervision of other normal pregnancy, antepartum on their problem list.  ----------------------------------------------------------------------------------- Patient reports  pelvic pressure  . Encouraged  use of support belt.  -Doing well. Mood has been good. Sleep is good -Her mother and the FOB will be her support during labor -Watching youtube videos on birth, a little nervous fearful of the epidural, reviewed other pain management options in labor, Laura Higgins is sure she will want an epidural   Contractions: Not present. Vag. Bleeding: None.  Movement: Present. Leaking Fluid denies.  ----------------------------------------------------------------------------------- The following portions of the patient's history were reviewed and updated as appropriate: allergies, current medications, past family history, past medical history, past social history, past surgical history and problem list. Problem list updated.  Objective  Blood pressure 125/75, pulse (!) 111, weight 217 lb 3.2 oz (98.5 kg), last menstrual period 12/25/2021. Pregravid weight 184 lb (83.5 kg) Total Weight Gain 33 lb 3.2 oz (15.1 kg) Urinalysis: Urine Protein    Urine Glucose    Fetal Status: Fetal Heart Rate (bpm): 145 Fundal Height: 30 cm Movement: Present     General:  Alert, oriented  and cooperative. Patient is in no acute distress.  Skin: Skin is warm and dry. No rash noted.   Cardiovascular: Normal heart rate noted  Respiratory: Normal respiratory effort, no problems with respiration noted  Abdomen: Soft, gravid, appropriate for gestational age. Pain/Pressure: Absent     Pelvic:  Cervical exam deferred        Extremities: Normal range of motion.  Edema: Trace  Mental Status: Normal mood and affect. Normal behavior. Normal judgment and thought content.   Assessment   22 y.o. G1P0000 at [redacted]w[redacted]d by  10/01/2022, by Ultrasound presenting for routine prenatal visit  Plan   first Problems (from 03/13/22 to present)     Problem Noted Resolved   Rubella non-immune status, antepartum 03/18/2022 by Rich Number, RN No   Overview Signed 03/18/2022 12:26 PM by Rich Number, RN    Rubella non-immune with hx of 2 documented MMRs MMR x1 post-partum      Supervision of high risk pregnancy, antepartum 03/13/2022 by Herbie Saxon, CNM No   Overview Addendum 04/16/2022  1:24 PM by Rich Number, RN     Nursing Staff Provider  Office Location  [facility] Dating  On 02/10/22 @ 6 5/7  =  10/01/22  Language  English Anatomy US    Flu Vaccine  Declined 03/13/22 Genetic Screen  NIPS (MT21) 03/13/22  =  neg  AFP:    TDaP vaccine    Hgb A1C or  GTT Early: 03/13/22   =  116       (03/13/22) Third trimester   COVID vaccine Declined 03/13/22    Rhogam     LAB RESULTS   Feeding Plan Formula  Blood Type   O positive                     (  03/13/22)  Contraception Undecided - resource list given 03/13/22 Antibody  negative                         (03/13/22)  Circumcision  Rubella  MMR x2 (01/28/00, 10/17/03)  non-immune                    (03/13/22)  Pediatrician  Princella Ion RPR   Reactive                        (03/13/22)  Support Person  HBsAg   negative                         (03/13/22)  Prenatal Classes  HIV   non-reactive                   (03/13/22)  @28wk - Doula referral?  Varicella  Varivax x 2 (01/28/00; 11/28/06)    HCV   non-reactive                    (03/13/22)  BTL Consent  GBS  (For PCN allergy, check sensitivities)        VBAC Consent  Pap  03/13/22  =   neg    Hgb Electro   Elevation in Hgb F, low Hgb A2            (03/13/22)  BP Cuff ordered  CF   Delivery Group  [facility]   Centering Group  OBCM involved               Preterm labor symptoms and general obstetric precautions including but not limited to vaginal bleeding, contractions, leaking of fluid and fetal movement were reviewed in detail with the patient. Please refer to After Visit Summary for other counseling recommendations.   Return in about 2 weeks (around 07/16/2022) for [place].  28 week labs collected   Growth [place] ordered   [place], [place] [facility]  07/02/22  3:01 PM

## 2022-07-04 ENCOUNTER — Telehealth: Payer: Self-pay

## 2022-07-04 LAB — 28 WEEK RH+PANEL
Basophils Absolute: 0 10*3/uL (ref 0.0–0.2)
Basos: 0 %
EOS (ABSOLUTE): 0.3 10*3/uL (ref 0.0–0.4)
Eos: 3 %
Gestational Diabetes Screen: 142 mg/dL — ABNORMAL HIGH (ref 70–139)
HIV Screen 4th Generation wRfx: NONREACTIVE
Hematocrit: 35.2 % (ref 34.0–46.6)
Hemoglobin: 11.7 g/dL (ref 11.1–15.9)
Immature Grans (Abs): 0.1 10*3/uL (ref 0.0–0.1)
Immature Granulocytes: 1 %
Lymphocytes Absolute: 1.4 10*3/uL (ref 0.7–3.1)
Lymphs: 16 %
MCH: 26.7 pg (ref 26.6–33.0)
MCHC: 33.2 g/dL (ref 31.5–35.7)
MCV: 80 fL (ref 79–97)
Monocytes Absolute: 0.5 10*3/uL (ref 0.1–0.9)
Monocytes: 6 %
Neutrophils Absolute: 6.6 10*3/uL (ref 1.4–7.0)
Neutrophils: 74 %
Platelets: 191 10*3/uL (ref 150–450)
RBC: 4.39 x10E6/uL (ref 3.77–5.28)
RDW: 17.1 % — ABNORMAL HIGH (ref 11.7–15.4)
RPR Ser Ql: REACTIVE — AB
WBC: 8.9 10*3/uL (ref 3.4–10.8)

## 2022-07-04 LAB — RPR, QUANT+TP ABS (REFLEX)
Rapid Plasma Reagin, Quant: 1:32 {titer} — ABNORMAL HIGH
T Pallidum Abs: REACTIVE — AB

## 2022-07-04 NOTE — Telephone Encounter (Signed)
Pt calling for explanation of lab results; adv one hour glucose is elevatd and will need 3h gtt scheduled and to be NPO after midnight the night before; also has syphylis and will need tx for that as well.  Will have schedulers to call her to schedule her 3h gtt.

## 2022-07-08 ENCOUNTER — Other Ambulatory Visit: Payer: Self-pay

## 2022-07-08 DIAGNOSIS — Z131 Encounter for screening for diabetes mellitus: Secondary | ICD-10-CM

## 2022-07-08 NOTE — Telephone Encounter (Signed)
Per Dr. Hulan Fray no treatment for RPR needed, 3 hr ordered and scheduled.

## 2022-07-09 ENCOUNTER — Other Ambulatory Visit: Payer: Medicaid Other

## 2022-07-09 NOTE — Telephone Encounter (Signed)
Patient is scheduled for 3Gtt on 1/31 at 8am. Patient no showed

## 2022-07-10 ENCOUNTER — Other Ambulatory Visit: Payer: Medicaid Other

## 2022-07-11 ENCOUNTER — Other Ambulatory Visit: Payer: Self-pay | Admitting: Licensed Practical Nurse

## 2022-07-11 DIAGNOSIS — Z3480 Encounter for supervision of other normal pregnancy, unspecified trimester: Secondary | ICD-10-CM

## 2022-07-11 DIAGNOSIS — Z131 Encounter for screening for diabetes mellitus: Secondary | ICD-10-CM

## 2022-07-11 MED ORDER — ACCU-CHEK SOFTCLIX LANCETS MISC: 1.0000 | Freq: Four times a day (QID) | 12 refills | Status: DC

## 2022-07-11 MED ORDER — ACCU-CHEK GUIDE VI STRP: 1.0000 | ORAL_STRIP | Freq: Four times a day (QID) | 6 refills | Status: DC

## 2022-07-11 MED ORDER — ACCU-CHEK GUIDE W/DEVICE KIT: 1.0000 | PACK | Freq: Four times a day (QID) | 0 refills | Status: DC

## 2022-07-12 ENCOUNTER — Ambulatory Visit
Admission: RE | Admit: 2022-07-12 | Discharge: 2022-07-12 | Disposition: A | Discharge status: Home / Self Care | Payer: BC Managed Care – PPO | Source: Ambulatory Visit | Attending: Licensed Practical Nurse | Admitting: Licensed Practical Nurse

## 2022-07-12 DIAGNOSIS — O0990 Supervision of high risk pregnancy, unspecified, unspecified trimester: Secondary | ICD-10-CM | POA: Insufficient documentation

## 2022-07-12 DIAGNOSIS — O26843 Uterine size-date discrepancy, third trimester: Secondary | ICD-10-CM | POA: Diagnosis not present

## 2022-07-12 DIAGNOSIS — Z3A28 28 weeks gestation of pregnancy: Secondary | ICD-10-CM | POA: Diagnosis not present

## 2022-07-16 ENCOUNTER — Encounter: Payer: Self-pay | Admitting: Obstetrics

## 2022-07-16 ENCOUNTER — Ambulatory Visit (INDEPENDENT_AMBULATORY_CARE_PROVIDER_SITE_OTHER): Payer: BC Managed Care – PPO | Admitting: Obstetrics

## 2022-07-16 VITALS — BP 122/80 | HR 120 | Wt 217.0 lb

## 2022-07-16 DIAGNOSIS — Z23 Encounter for immunization: Secondary | ICD-10-CM | POA: Diagnosis not present

## 2022-07-16 DIAGNOSIS — R7309 Other abnormal glucose: Secondary | ICD-10-CM

## 2022-07-16 DIAGNOSIS — O99891 Other specified diseases and conditions complicating pregnancy: Secondary | ICD-10-CM

## 2022-07-16 DIAGNOSIS — Z6841 Body Mass Index (BMI) 40.0 and over, adult: Secondary | ICD-10-CM

## 2022-07-16 DIAGNOSIS — Z3A29 29 weeks gestation of pregnancy: Secondary | ICD-10-CM

## 2022-07-16 DIAGNOSIS — Z3403 Encounter for supervision of normal first pregnancy, third trimester: Secondary | ICD-10-CM

## 2022-07-16 NOTE — Progress Notes (Signed)
Laura Higgins at [redacted]w[redacted]d. Active baby. Denies ctx, LOF, and vaginal bleeding. Reviewed RPR results. Laura Higgins has not been able to keep her 3-hour glucose test (vomited, then unable to keep next visit). Encouraged to try again or do BS at home and keep a log. She prefers to try 3-hour again. Discussed making a birth plan. Undecided about contraception; handout given and options reviewed. S>D today. Growth [place] ordered. [place] q 4 weeks for BMI>40. RTC in 2 weeks.  Laura Higgins, CNM

## 2022-07-18 ENCOUNTER — Encounter: Payer: Self-pay | Admitting: Obstetrics

## 2022-07-19 ENCOUNTER — Encounter: Payer: Self-pay | Admitting: Obstetrics and Gynecology

## 2022-07-19 ENCOUNTER — Other Ambulatory Visit: Payer: Medicaid Other

## 2022-07-19 DIAGNOSIS — R7309 Other abnormal glucose: Secondary | ICD-10-CM

## 2022-07-20 LAB — GESTATIONAL GLUCOSE TOLERANCE
Glucose, Fasting: 83 mg/dL (ref 70–94)
Glucose, GTT - 1 Hour: 160 mg/dL (ref 70–179)
Glucose, GTT - 2 Hour: 130 mg/dL (ref 70–154)
Glucose, GTT - 3 Hour: 91 mg/dL (ref 70–139)
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
## 2022-07-29 ENCOUNTER — Ambulatory Visit
Admission: RE | Admit: 2022-07-29 | Discharge: 2022-07-29 | Disposition: A | Discharge status: Home / Self Care | Payer: BC Managed Care – PPO | Source: Ambulatory Visit | Attending: Obstetrics | Admitting: Obstetrics

## 2022-07-29 DIAGNOSIS — Z6841 Body Mass Index (BMI) 40.0 and over, adult: Secondary | ICD-10-CM

## 2022-07-29 DIAGNOSIS — Z3689 Encounter for other specified antenatal screening: Secondary | ICD-10-CM | POA: Insufficient documentation

## 2022-07-29 DIAGNOSIS — O99213 Obesity complicating pregnancy, third trimester: Secondary | ICD-10-CM | POA: Insufficient documentation

## 2022-07-29 DIAGNOSIS — Z3A30 30 weeks gestation of pregnancy: Secondary | ICD-10-CM | POA: Diagnosis not present

## 2022-07-29 DIAGNOSIS — Z369 Encounter for antenatal screening, unspecified: Secondary | ICD-10-CM | POA: Diagnosis not present

## 2022-07-30 ENCOUNTER — Encounter: Payer: Self-pay | Admitting: Obstetrics and Gynecology

## 2022-07-30 ENCOUNTER — Ambulatory Visit (INDEPENDENT_AMBULATORY_CARE_PROVIDER_SITE_OTHER): Payer: Medicaid Other | Admitting: Obstetrics and Gynecology

## 2022-07-30 VITALS — BP 125/81 | HR 101 | Wt 221.4 lb

## 2022-07-30 DIAGNOSIS — O99210 Obesity complicating pregnancy, unspecified trimester: Secondary | ICD-10-CM

## 2022-07-30 DIAGNOSIS — Z3A31 31 weeks gestation of pregnancy: Secondary | ICD-10-CM

## 2022-07-30 DIAGNOSIS — Z3403 Encounter for supervision of normal first pregnancy, third trimester: Secondary | ICD-10-CM

## 2022-07-30 LAB — POCT URINALYSIS DIPSTICK OB
Bilirubin, UA: NEGATIVE
Blood, UA: NEGATIVE
Glucose, UA: NEGATIVE
Ketones, UA: NEGATIVE
Leukocytes, UA: NEGATIVE
Nitrite, UA: NEGATIVE
POC,PROTEIN,UA: NEGATIVE
Spec Grav, UA: 1.02 (ref 1.010–1.025)
Urobilinogen, UA: 0.2 E.U./dL
pH, UA: 6.5 (ref 5.0–8.0)

## 2022-07-30 NOTE — Progress Notes (Signed)
ROB  [place] for S<D = 21%.  Reports daily movement.  Has no complaints.

## 2022-07-30 NOTE — Progress Notes (Signed)
ROB. Patient states daily fetal movement, no complaints of pain or pressure. Growth scan completed on 07/29/22. She states no questions or concerns at this time.

## 2022-07-31 ENCOUNTER — Other Ambulatory Visit: Payer: Self-pay | Admitting: Obstetrics

## 2022-07-31 ENCOUNTER — Encounter: Payer: Self-pay | Admitting: Obstetrics

## 2022-07-31 DIAGNOSIS — Z362 Encounter for other antenatal screening follow-up: Secondary | ICD-10-CM

## 2022-08-01 NOTE — Addendum Note (Signed)
Addended by: Meryl Dare on: 08/01/2022 04:50 PM   Modules accepted: Orders

## 2022-08-01 NOTE — Progress Notes (Signed)
Order changed per MFM requirements of Detail Scan for all new patients.

## 2022-08-01 NOTE — Addendum Note (Signed)
Addended by: Meryl Dare on: 08/01/2022 08:56 AM   Modules accepted: Orders

## 2022-08-13 ENCOUNTER — Ambulatory Visit (INDEPENDENT_AMBULATORY_CARE_PROVIDER_SITE_OTHER): Payer: Medicaid Other | Admitting: Obstetrics

## 2022-08-13 VITALS — BP 124/61 | HR 110 | Wt 220.8 lb

## 2022-08-13 DIAGNOSIS — Z3A33 33 weeks gestation of pregnancy: Secondary | ICD-10-CM

## 2022-08-13 DIAGNOSIS — Z3403 Encounter for supervision of normal first pregnancy, third trimester: Secondary | ICD-10-CM

## 2022-08-13 DIAGNOSIS — O0990 Supervision of high risk pregnancy, unspecified, unspecified trimester: Secondary | ICD-10-CM

## 2022-08-13 DIAGNOSIS — Z113 Encounter for screening for infections with a predominantly sexual mode of transmission: Secondary | ICD-10-CM

## 2022-08-13 LAB — POCT URINALYSIS DIPSTICK OB
Bilirubin, UA: NEGATIVE
Blood, UA: NEGATIVE
Glucose, UA: NEGATIVE
Ketones, UA: NEGATIVE
Leukocytes, UA: NEGATIVE
Nitrite, UA: NEGATIVE
POC,PROTEIN,UA: NEGATIVE
Spec Grav, UA: 1.015 (ref 1.010–1.025)
Urobilinogen, UA: 0.2 E.U./dL
pH, UA: 7 (ref 5.0–8.0)

## 2022-08-13 NOTE — Progress Notes (Signed)
Routine Prenatal Care Visit  Subjective  Laura Higgins is a 22 y.o. G1P0000 at 64w0dbeing seen today for ongoing prenatal care.  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 Coli; Abnormal blood finding 03/13/22 Hgb fractionation (low HgbA2, elevated Hgb F); Positive urine drug screen  04/16/22 +MJ; and Supervision of other normal pregnancy, antepartum on their problem list.  ----------------------------------------------------------------------------------- Patient reports no complaints.  She and the FOB haven't yet settled on a name for the baby. Contractions: Irritability. Vag. Bleeding: None.  Movement: Present. Leaking Fluid denies.  ----------------------------------------------------------------------------------- The following portions of the patient's history were reviewed and updated as appropriate: allergies, current medications, past family history, past medical history, past social history, past surgical history and problem list. Problem list updated.  Objective  Blood pressure 124/61, pulse (!) 110, weight 220 lb 12.8 oz (100.2 kg), last menstrual period 12/25/2021. Pregravid weight 184 lb (83.5 kg) Total Weight Gain 36 lb 12.8 oz (16.7 kg) Urinalysis: Urine Protein    Urine Glucose    Fetal Status:     Movement: Present     General:  Alert, oriented and cooperative. Patient is in no acute distress.  Skin: Skin is warm and dry. No rash noted.   Cardiovascular: Normal heart rate noted  Respiratory: Normal respiratory effort, no problems with respiration noted  Abdomen: Soft, gravid, appropriate for gestational age. Pain/Pressure: Present      Pelvic:  Cervical exam deferred        Extremities: Normal range of motion.  Edema: None  Mental Status: Normal mood and affect. Normal behavior. Normal judgment and thought content.   Assessment   22y.o. G1P0000 at 369w0dy  10/01/2022, by Ultrasound presenting for routine prenatal visit  Plan   first Problems (from 03/13/22 to present)     Problem Noted Resolved   Rubella non-immune status, antepartum 03/18/2022 by WaRich NumberRN No   Overview Signed 03/18/2022 12:26 PM by WaRich NumberRN    Rubella non-immune with hx of 2 documented MMRs MMR x1 post-partum      Supervision of high risk pregnancy, antepartum 03/13/2022 by ScHerbie SaxonCNM No   Overview Addendum 04/16/2022  1:24 PM by WaRich NumberRN     Nursing Staff Provider  Office Location  [facility] Dating  On 02/10/22 @ 6 5/7  =  10/01/22  Language  English Anatomy [place]  Flu Vaccine  Declined 03/13/22 Genetic Screen  NIPS (MT21) 03/13/22  =  neg  AFP:    TDaP vaccine   07/16/22 Hgb A1C or  GTT Early: 03/13/22   =  116       (03/13/22) Third trimester   COVID vaccine Declined 03/13/22    Rhogam     LAB RESULTS   Feeding Plan Formula  Blood Type   O positive                     (03/13/22)  Contraception Undecided - resource list given 03/13/22 Antibody  negative                         (03/13/22)  Circumcision  Rubella  MMR x2 (01/28/00, 10/17/03)  non-immune                    (  03/13/22)  Pediatrician  Princella Ion RPR   Reactive                        (03/13/22)  Support Person  HBsAg   negative                         (03/13/22)  Prenatal Classes  HIV   non-reactive                   (03/13/22)  '@28wk'$ - Doula referral?  Varicella Varivax x 2 (01/28/00; 11/28/06)    HCV   non-reactive                    (03/13/22)  BTL Consent  GBS  (For PCN allergy, check sensitivities)        VBAC Consent  Pap  03/13/22  =   neg    Hgb Electro   Elevation in Hgb F, low Hgb A2            (03/13/22)  BP Cuff ordered  CF   Delivery Group   [facility]   Centering Group  OBCM involved               Preterm labor symptoms and general obstetric precautions including but not limited to vaginal bleeding, contractions, leaking of fluid and fetal movement were reviewed in detail with the patient. Please refer to After Visit Summary for other counseling recommendations.  We discussed the GBS and cultures testing for next visit. She would like to try for an unmedicated labor- is watching videos and trying to learn more about birth. Likes Dr. Amalia Hailey- would love for him to deliver her.  Return in about 2 weeks (around 08/27/2022) for return OB.  Imagene Riches, CNM  08/13/2022 10:50 AM

## 2022-08-26 ENCOUNTER — Ambulatory Visit: Payer: BC Managed Care – PPO | Attending: Obstetrics and Gynecology

## 2022-08-26 ENCOUNTER — Encounter: Payer: Self-pay | Admitting: Obstetrics

## 2022-08-26 ENCOUNTER — Other Ambulatory Visit: Payer: Self-pay

## 2022-08-26 VITALS — BP 133/77 | HR 110 | Temp 98.2°F | Ht 61.0 in | Wt 223.5 lb

## 2022-08-26 DIAGNOSIS — F1290 Cannabis use, unspecified, uncomplicated: Secondary | ICD-10-CM | POA: Diagnosis not present

## 2022-08-26 DIAGNOSIS — O09899 Supervision of other high risk pregnancies, unspecified trimester: Secondary | ICD-10-CM

## 2022-08-26 DIAGNOSIS — Z8619 Personal history of other infectious and parasitic diseases: Secondary | ICD-10-CM

## 2022-08-26 DIAGNOSIS — E669 Obesity, unspecified: Secondary | ICD-10-CM

## 2022-08-26 DIAGNOSIS — Z362 Encounter for other antenatal screening follow-up: Secondary | ICD-10-CM | POA: Insufficient documentation

## 2022-08-26 DIAGNOSIS — Z3A34 34 weeks gestation of pregnancy: Secondary | ICD-10-CM

## 2022-08-26 DIAGNOSIS — O99323 Drug use complicating pregnancy, third trimester: Secondary | ICD-10-CM

## 2022-08-26 DIAGNOSIS — O0933 Supervision of pregnancy with insufficient antenatal care, third trimester: Secondary | ICD-10-CM | POA: Insufficient documentation

## 2022-08-26 DIAGNOSIS — O99213 Obesity complicating pregnancy, third trimester: Secondary | ICD-10-CM

## 2022-08-26 DIAGNOSIS — O0990 Supervision of high risk pregnancy, unspecified, unspecified trimester: Secondary | ICD-10-CM

## 2022-08-27 ENCOUNTER — Ambulatory Visit: Payer: BC Managed Care – PPO

## 2022-08-28 ENCOUNTER — Ambulatory Visit (INDEPENDENT_AMBULATORY_CARE_PROVIDER_SITE_OTHER): Payer: BC Managed Care – PPO

## 2022-08-28 ENCOUNTER — Other Ambulatory Visit (HOSPITAL_COMMUNITY)
Admission: RE | Admit: 2022-08-28 | Discharge: 2022-08-28 | Disposition: A | Discharge status: Home / Self Care | Payer: BC Managed Care – PPO | Source: Ambulatory Visit | Attending: Obstetrics | Admitting: Obstetrics

## 2022-08-28 ENCOUNTER — Ambulatory Visit (INDEPENDENT_AMBULATORY_CARE_PROVIDER_SITE_OTHER): Payer: BC Managed Care – PPO | Admitting: Obstetrics

## 2022-08-28 VITALS — BP 126/81 | HR 118 | Ht 60.0 in | Wt 223.0 lb

## 2022-08-28 VITALS — BP 126/81 | HR 118 | Wt 223.0 lb

## 2022-08-28 DIAGNOSIS — E669 Obesity, unspecified: Secondary | ICD-10-CM | POA: Diagnosis not present

## 2022-08-28 DIAGNOSIS — O0993 Supervision of high risk pregnancy, unspecified, third trimester: Secondary | ICD-10-CM

## 2022-08-28 DIAGNOSIS — O0990 Supervision of high risk pregnancy, unspecified, unspecified trimester: Secondary | ICD-10-CM | POA: Diagnosis not present

## 2022-08-28 DIAGNOSIS — Z3A00 Weeks of gestation of pregnancy not specified: Secondary | ICD-10-CM | POA: Insufficient documentation

## 2022-08-28 DIAGNOSIS — Z3A35 35 weeks gestation of pregnancy: Secondary | ICD-10-CM

## 2022-08-28 DIAGNOSIS — Z113 Encounter for screening for infections with a predominantly sexual mode of transmission: Secondary | ICD-10-CM

## 2022-08-28 DIAGNOSIS — O99213 Obesity complicating pregnancy, third trimester: Secondary | ICD-10-CM | POA: Diagnosis not present

## 2022-08-28 DIAGNOSIS — Z3685 Encounter for antenatal screening for Streptococcus B: Secondary | ICD-10-CM

## 2022-08-28 LAB — POCT URINALYSIS DIPSTICK OB
Bilirubin, UA: NEGATIVE
Blood, UA: NEGATIVE
Glucose, UA: NEGATIVE
Ketones, UA: NEGATIVE
Leukocytes, UA: NEGATIVE
Nitrite, UA: NEGATIVE
POC,PROTEIN,UA: NEGATIVE
Spec Grav, UA: 1.02 (ref 1.010–1.025)
Urobilinogen, UA: 0.2 E.U./dL
pH, UA: 7 (ref 5.0–8.0)

## 2022-08-28 NOTE — Progress Notes (Signed)
    NURSE VISIT NOTE  Subjective:    Patient ID: Laura Higgins, female    DOB: 16-Dec-2000, 22 y.o.   MRN: [id number]  HPI  Patient is a 22 y.o. G39P0000 female who presents for fetal monitoring per order from Lloyd Huger, CNM.   Objective:    BP 126/81   Pulse (!) 118   Ht 5' (1.524 m)   Wt 223 lb (101.2 kg)   LMP 12/25/2021 (Exact Date)   BMI 43.55 kg/m  Estimated Date of Delivery: 10/01/22  [redacted]w[redacted]d  Fetus A Non-Stress Test Interpretation for 08/28/22  Indication: Obesity  Fetal Heart Rate A Mode: External Baseline Rate (A): 135 bpm Variability: Moderate Accelerations: 15 x 15 Decelerations: None Multiple birth?: No  Uterine Activity Mode: Toco Contraction Frequency (min): None  Interpretation (Fetal Testing) Nonstress Test Interpretation: Reactive Overall Impression: Reassuring for gestational age   Assessment:   1. Obesity affecting pregnancy in third trimester, unspecified obesity type   2. [redacted] weeks gestation of pregnancy      Plan:   Results reviewed and discussed with patient by  Lloyd Huger, CNM.     Otila Kluver, LPN

## 2022-08-28 NOTE — Patient Instructions (Signed)

## 2022-08-28 NOTE — Progress Notes (Signed)
ROB at [redacted]w[redacted]d. Active baby. Having some BH ctx. Denies LOF and vaginal bleeding. [place] reports she is feeling well overall and staying active. MFM [place] normal. NST today. GBS and GC/chlamydia self-collected. RNST today (see note). Weekly NSTs until birth. RTC in one week.  Lurlean Horns, CNM

## 2022-08-29 ENCOUNTER — Encounter: Payer: Self-pay | Admitting: Obstetrics

## 2022-08-29 LAB — CERVICOVAGINAL ANCILLARY ONLY
Candida Glabrata: NEGATIVE
Candida Vaginitis: NEGATIVE
Chlamydia: NEGATIVE
Comment: NEGATIVE
Comment: NEGATIVE
Comment: NEGATIVE
Comment: NEGATIVE
Comment: NORMAL
Neisseria Gonorrhea: NEGATIVE
Trichomonas: NEGATIVE

## 2022-08-30 LAB — STREP GP B NAA: Strep Gp B NAA: NEGATIVE

## 2022-09-02 ENCOUNTER — Telehealth: Payer: Self-pay | Admitting: Licensed Clinical Social Worker

## 2022-09-02 NOTE — Telephone Encounter (Signed)
-----   Message from Kieth Brightly sent at 09/02/2022  8:16 AM EDT ----- Regarding: mh referral Morning [place] all is well.  At your earliest convenience would you please f/u with this member.  She is currently in her third trimester of pregnancy, due next month, and reporting mental health needs.  Thanks,

## 2022-09-03 NOTE — Progress Notes (Unsigned)
    NURSE VISIT NOTE  Subjective:    Patient ID: Laura Higgins, female    DOB: 2000-06-19, 22 y.o.   MRN: [id number]  HPI  Patient is a 22 y.o. G2P0000 female who presents for fetal monitoring per order from Lloyd Huger, CNM.   Objective:    LMP 12/25/2021 (Exact Date)  Estimated Date of Delivery: 10/01/22  [redacted]w[redacted]d  Fetus A Non-Stress Test Interpretation for 09/03/22  Indication: Obesity            Assessment:   1. Obesity affecting pregnancy in third trimester, unspecified obesity type   2. [redacted] weeks gestation of pregnancy      Plan:   Results reviewed and discussed with patient by  Rod Can, CNM.     Otila Kluver, LPN

## 2022-09-03 NOTE — Patient Instructions (Signed)

## 2022-09-04 ENCOUNTER — Encounter: Payer: Self-pay | Admitting: Advanced Practice Midwife

## 2022-09-04 ENCOUNTER — Ambulatory Visit (INDEPENDENT_AMBULATORY_CARE_PROVIDER_SITE_OTHER): Payer: BC Managed Care – PPO

## 2022-09-04 ENCOUNTER — Ambulatory Visit (INDEPENDENT_AMBULATORY_CARE_PROVIDER_SITE_OTHER): Payer: BC Managed Care – PPO | Admitting: Advanced Practice Midwife

## 2022-09-04 VITALS — BP 121/77 | HR 125 | Ht 60.0 in | Wt 225.5 lb

## 2022-09-04 VITALS — BP 121/77 | HR 125 | Wt 225.5 lb

## 2022-09-04 DIAGNOSIS — O0990 Supervision of high risk pregnancy, unspecified, unspecified trimester: Secondary | ICD-10-CM

## 2022-09-04 DIAGNOSIS — E669 Obesity, unspecified: Secondary | ICD-10-CM

## 2022-09-04 DIAGNOSIS — O99213 Obesity complicating pregnancy, third trimester: Secondary | ICD-10-CM

## 2022-09-04 DIAGNOSIS — Z3A36 36 weeks gestation of pregnancy: Secondary | ICD-10-CM | POA: Diagnosis not present

## 2022-09-04 DIAGNOSIS — Z2839 Other underimmunization status: Secondary | ICD-10-CM

## 2022-09-04 DIAGNOSIS — O0993 Supervision of high risk pregnancy, unspecified, third trimester: Secondary | ICD-10-CM

## 2022-09-04 NOTE — Progress Notes (Signed)
Routine Prenatal Care Visit  Subjective  Laura Higgins is a 22 y.o. G1P0000 at [redacted]w[redacted]d being seen today for ongoing prenatal care.  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 Coli; Abnormal blood finding 03/13/22 Hgb fractionation (low HgbA2, elevated Hgb F); and Positive urine drug screen  04/16/22 +MJ on their problem list.  ----------------------------------------------------------------------------------- Patient reports  she is doing well and she has no questions or concerns today .   Contractions: Not present. Vag. Bleeding: None.  Movement: Present. Leaking Fluid denies.  ----------------------------------------------------------------------------------- The following portions of the patient's history were reviewed and updated as appropriate: allergies, current medications, past family history, past medical history, past social history, past surgical history and problem list. Problem list updated.  Objective  Blood pressure 121/77, pulse (!) 125, weight 225 lb 8 oz (102.3 kg), last menstrual period 12/25/2021. Pregravid weight 184 lb (83.5 kg) Total Weight Gain 41 lb 8 oz (18.8 kg) Urinalysis: Urine Protein    Urine Glucose    Fetal Status: Fetal Heart Rate (bpm): 140   Movement: Present    Reactive NST today; see nurse note  General:  Alert, oriented and cooperative. Patient is in no acute distress.  Skin: Skin is warm and dry. No rash noted.   Cardiovascular: Normal heart rate noted  Respiratory: Normal respiratory effort, no problems with respiration noted  Abdomen: Soft, gravid, appropriate for gestational age.  Pain/Pressure: Absent     Pelvic:  Cervical exam deferred        Extremities: Normal range of motion.  Edema: None  Mental Status: Normal mood and affect. Normal behavior. Normal judgment and thought content.   Assessment   22 y.o. G1P0000 at [redacted]w[redacted]d by  10/01/2022, by Ultrasound presenting for routine prenatal visit  Plan   first Problems (from 03/13/22 to present)     Problem Noted Resolved   Rubella non-immune status, antepartum 03/18/2022 by Rich Number, RN No   Overview Signed 03/18/2022 12:26 PM by Rich Number, RN    Rubella non-immune with hx of 2 documented MMRs MMR x1 post-partum      Supervision of high risk pregnancy, antepartum 03/13/2022 by Herbie Saxon, CNM No   Overview Addendum 09/04/2022  3:38 PM by Rod Can, CNM     Nursing Staff Provider  Office Location [facility] Dating  On 02/10/22 @ 6 5/7  =  10/01/22  Language  English Anatomy US    Flu Vaccine  Declined 03/13/22 Genetic Screen  NIPS (MT21) 03/13/22  =  neg  AFP:    TDaP vaccine    Hgb A1C or  GTT Early: 03/13/22   =  116       (03/13/22) Third trimester: 142, normal 3hr gtt  COVID vaccine Declined 03/13/22    Rhogam     LAB RESULTS   Feeding Plan Formula  Blood Type   O positive                     (03/13/22)  Contraception Undecided - resource list given 03/13/22 Antibody  negative                         (03/13/22)  Circumcision  Rubella  MMR x2 (01/28/00, 10/17/03)  non-immune                    (03/13/22)  Pediatrician  Princella Ion RPR   Reactive                        (03/13/22)  Support Person [place] HBsAg   negative                         (03/13/22)  Prenatal Classes  HIV   non-reactive                   (03/13/22)  @28wk - Doula referral?  Varicella Varivax x 2 (01/28/00; 11/28/06)    HCV   non-reactive                    (03/13/22)  BTL Consent  GBS  (For PCN allergy, check sensitivities)        VBAC Consent NA Pap  03/13/22  =   neg    Hgb Electro   Elevation in Hgb F, low Hgb A2             (03/13/22)  BP Cuff ordered  CF   Delivery Group [facility]   Centering Group  OBCM involved               Preterm labor symptoms and general obstetric precautions including but not limited to vaginal bleeding, contractions, leaking of fluid and fetal movement were reviewed in detail with the patient. Please refer to After Visit Summary for other counseling recommendations.   Return in about 1 week (around 09/11/2022) for NST and ROB.  Rod Can, CNM 09/04/2022 4:22 PM

## 2022-09-10 ENCOUNTER — Encounter: Payer: Self-pay | Admitting: Advanced Practice Midwife

## 2022-09-10 DIAGNOSIS — Z3A37 37 weeks gestation of pregnancy: Secondary | ICD-10-CM | POA: Insufficient documentation

## 2022-09-10 NOTE — Progress Notes (Unsigned)
    NURSE VISIT NOTE  Subjective:    Patient ID: Laura Higgins, female    DOB: 2000-10-22, 22 y.o.   MRN: [id number]  HPI  Patient is a 22 y.o. G95P0000 female who presents for fetal monitoring per order from Rod Can, CNM.   Objective:    LMP 12/25/2021 (Exact Date)  Estimated Date of Delivery: 10/01/22  [redacted]w[redacted]d  Fetus A Non-Stress Test Interpretation for 09/10/22  Indication: Obesity            Assessment:   1. Obesity affecting pregnancy in third trimester, unspecified obesity type   2. [redacted] weeks gestation of pregnancy      Plan:   Results reviewed and discussed with patient by  Gigi Gin, CNM.     Otila Kluver, LPN

## 2022-09-10 NOTE — Patient Instructions (Signed)

## 2022-09-11 ENCOUNTER — Ambulatory Visit (INDEPENDENT_AMBULATORY_CARE_PROVIDER_SITE_OTHER): Payer: BC Managed Care – PPO

## 2022-09-11 ENCOUNTER — Ambulatory Visit (INDEPENDENT_AMBULATORY_CARE_PROVIDER_SITE_OTHER): Payer: BC Managed Care – PPO | Admitting: Obstetrics

## 2022-09-11 VITALS — BP 135/89 | HR 114 | Wt 223.2 lb

## 2022-09-11 VITALS — BP 135/89 | HR 114 | Resp 16 | Ht 60.0 in | Wt 223.2 lb

## 2022-09-11 DIAGNOSIS — E669 Obesity, unspecified: Secondary | ICD-10-CM

## 2022-09-11 DIAGNOSIS — O99213 Obesity complicating pregnancy, third trimester: Secondary | ICD-10-CM

## 2022-09-11 DIAGNOSIS — Z3A37 37 weeks gestation of pregnancy: Secondary | ICD-10-CM

## 2022-09-11 DIAGNOSIS — O0990 Supervision of high risk pregnancy, unspecified, unspecified trimester: Secondary | ICD-10-CM

## 2022-09-11 LAB — POCT URINALYSIS DIPSTICK OB
Bilirubin, UA: NEGATIVE
Blood, UA: NEGATIVE
Glucose, UA: NEGATIVE
Ketones, UA: NEGATIVE
Leukocytes, UA: NEGATIVE
Nitrite, UA: NEGATIVE
Spec Grav, UA: 1.015 (ref 1.010–1.025)
Urobilinogen, UA: 0.2 E.U./dL
pH, UA: 6 (ref 5.0–8.0)

## 2022-09-11 NOTE — Progress Notes (Signed)
Routine Prenatal Care Visit  Subjective  Laura Higgins is a 22 y.o. G1P0000 at [redacted]w[redacted]d being seen today for ongoing prenatal care.  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 Coli; Abnormal blood finding 03/13/22 Hgb fractionation (low HgbA2, elevated Hgb F); Positive urine drug screen  04/16/22 +MJ; and [redacted] weeks gestation of pregnancy on their problem list.  ----------------------------------------------------------------------------------- Patient reports no complaints.  Her baby is moving well. she had a reactive NST today. Her GBS is negative Contractions: Not present. Vag. Bleeding: None.  Movement: Present. Leaking Fluid denies.  ----------------------------------------------------------------------------------- The following portions of the patient's history were reviewed and updated as appropriate: allergies, current medications, past family history, past medical history, past social history, past surgical history and problem list. Problem list updated.  Objective  Blood pressure 135/89, pulse (!) 114, weight 223 lb 3.2 oz (101.2 kg), last menstrual period 12/25/2021. Pregravid weight 184 lb (83.5 kg) Total Weight Gain 39 lb 3.2 oz (17.8 kg) Urinalysis: Urine Protein    Urine Glucose    Fetal Status: Fetal Heart Rate (bpm): 140 Fundal Height: 37 cm Movement: Present     General:  Alert, oriented and cooperative. Patient is in no acute distress.  Skin: Skin is warm and dry. No rash noted.   Cardiovascular: Normal heart rate noted  Respiratory: Normal respiratory effort, no problems with respiration noted  Abdomen:  Soft, gravid, appropriate for gestational age. Pain/Pressure: Present     Pelvic:  Cervical exam deferred        Extremities: Normal range of motion.  Edema: None  Mental Status: Normal mood and affect. Normal behavior. Normal judgment and thought content.   Assessment   22 y.o. G1P0000 at [redacted]w[redacted]d by  10/01/2022, by Ultrasound presenting for routine prenatal visit  Plan   first Problems (from 03/13/22 to present)     Problem Noted Resolved   Rubella non-immune status, antepartum 03/18/2022 by Rich Number, RN No   Overview Signed 03/18/2022 12:26 PM by Rich Number, RN    Rubella non-immune with hx of 2 documented MMRs MMR x1 post-partum      Supervision of high risk pregnancy, antepartum 03/13/2022 by Herbie Saxon, CNM No   Overview Addendum 09/11/2022  3:33 PM by Minette Headland, [place] Staff Provider  Office Location [facility]  On 02/10/22 @ 6 5/7  =  10/01/22  Language  English Anatomy US    Flu Vaccine  Declined 03/13/22 Genetic Screen  NIPS (MT21) 03/13/22  =  neg  AFP:    TDaP vaccine   07/16/22 Hgb A1C or  GTT Early: 03/13/22   =  116       (03/13/22) Third trimester: 142, normal 3hr gtt  COVID vaccine Declined 03/13/22    Rhogam     LAB RESULTS   Feeding Plan Formula  Blood Type   O positive                     (03/13/22)  Contraception Undecided - resource list given 03/13/22 Antibody  negative                         (  03/13/22)  Circumcision  Rubella  MMR x2 (01/28/00, 10/17/03)  non-immune                    (03/13/22)  Pediatrician  Princella Ion RPR   Reactive                        (03/13/22)  Support Person Nagir HBsAg   negative                         (03/13/22)  Prenatal Classes  HIV   non-reactive                   (03/13/22)  @28wk - Doula referral?  Varicella Varivax x 2 (01/28/00; 11/28/06)    HCV   non-reactive                    (03/13/22)  BTL Consent  GBS  (For PCN allergy, check sensitivities)        VBAC Consent NA Pap  03/13/22  =   neg     Hgb Electro   Elevation in Hgb F, low Hgb A2            (03/13/22)  BP Cuff ordered  CF   Delivery Group [facility]   Centering Group  OBCM involved               Term labor symptoms and general obstetric precautions including but not limited to vaginal bleeding, contractions, leaking of fluid and fetal movement were reviewed in detail with the patient. Please refer to After Visit Summary for other counseling recommendations.  Briefly talked about IOL by 40 wks due to high BMI. Need to discuss this next visit.  Return in about 1 week (around 09/18/2022) for return OB, NST , discuss IOL by 40?Marland Kitchen  Imagene Riches, CNM  09/11/2022 4:07 PM

## 2022-09-11 NOTE — Progress Notes (Signed)
    NURSE VISIT NOTE  Subjective:    Patient ID: Laura Higgins, female    DOB: January 09, 2001, 22 y.o.   MRN: [id number]  HPI  Patient is a 22 y.o. G69P0000 female who presents for fetal monitoring per order from Rod Can, CNM.   Objective:    BP 135/89   Pulse (!) 114   Resp 16   Ht 5' (1.524 m)   Wt 223 lb 3.2 oz (101.2 kg)   LMP 12/25/2021 (Exact Date)   BMI 43.59 kg/m  Estimated Date of Delivery: 10/01/22  [redacted]w[redacted]d  Fetus A Non-Stress Test Interpretation for 09/11/22  Indication: Obesity  Fetal Heart Rate A Mode: External Baseline Rate (A): 125 bpm Variability: Minimal Accelerations: 15 x 15 Decelerations: None Multiple birth?: No  Uterine Activity Mode: Toco Contraction Quality: Mild Resting Tone Palpated: Relaxed Resting Time: Adequate  Interpretation (Fetal Testing) Nonstress Test Interpretation: Reactive Overall Impression: Reassuring for gestational age   Assessment:   1. Obesity affecting pregnancy in third trimester, unspecified obesity type   2. [redacted] weeks gestation of pregnancy   3. Supervision of high risk pregnancy, antepartum   4. NST (non-stress test) reactive      Plan:   Results reviewed and discussed with patient by  Gigi Gin, CNM.     Chilton Greathouse, CMA

## 2022-09-12 ENCOUNTER — Encounter: Payer: Self-pay | Admitting: Obstetrics

## 2022-09-12 ENCOUNTER — Encounter: Payer: BC Managed Care – PPO | Admitting: Obstetrics

## 2022-09-16 ENCOUNTER — Telehealth: Payer: Self-pay | Admitting: Licensed Clinical Social Worker

## 2022-09-16 NOTE — Telephone Encounter (Signed)
-----   Message from Ellison Carwin sent at 09/16/2022 11:21 AM EDT ----- Regarding: follow up Morning Ms. Laura Higgins. Hope all is well.  This member messaged me on last Friday and was asking if she could reschedule her appt. With you on 04/18.  If you don't mind, would you please f/u with her on rescheduling that appt.? Thanks,

## 2022-09-17 ENCOUNTER — Encounter: Payer: Self-pay | Admitting: Licensed Practical Nurse

## 2022-09-17 ENCOUNTER — Ambulatory Visit (INDEPENDENT_AMBULATORY_CARE_PROVIDER_SITE_OTHER): Payer: BC Managed Care – PPO | Admitting: Licensed Practical Nurse

## 2022-09-17 ENCOUNTER — Ambulatory Visit (INDEPENDENT_AMBULATORY_CARE_PROVIDER_SITE_OTHER): Payer: BC Managed Care – PPO

## 2022-09-17 VITALS — BP 105/72 | HR 116 | Ht 60.0 in | Wt 227.8 lb

## 2022-09-17 VITALS — BP 105/72 | HR 116 | Wt 227.8 lb

## 2022-09-17 DIAGNOSIS — Z3A38 38 weeks gestation of pregnancy: Secondary | ICD-10-CM

## 2022-09-17 DIAGNOSIS — E669 Obesity, unspecified: Secondary | ICD-10-CM

## 2022-09-17 DIAGNOSIS — O0993 Supervision of high risk pregnancy, unspecified, third trimester: Secondary | ICD-10-CM

## 2022-09-17 DIAGNOSIS — O99213 Obesity complicating pregnancy, third trimester: Secondary | ICD-10-CM

## 2022-09-17 DIAGNOSIS — O0990 Supervision of high risk pregnancy, unspecified, unspecified trimester: Secondary | ICD-10-CM

## 2022-09-17 LAB — POCT URINALYSIS DIPSTICK OB
Bilirubin, UA: NEGATIVE
Blood, UA: NEGATIVE
Glucose, UA: NEGATIVE
Ketones, UA: NEGATIVE
Leukocytes, UA: NEGATIVE
Nitrite, UA: NEGATIVE
Spec Grav, UA: 1.01 (ref 1.010–1.025)
Urobilinogen, UA: 0.2 E.U./dL
pH, UA: 6 (ref 5.0–8.0)

## 2022-09-17 NOTE — Addendum Note (Signed)
Addended by: Cornelius Moras D on: 09/17/2022 11:34 AM   Modules accepted: Orders

## 2022-09-17 NOTE — Progress Notes (Signed)
    NURSE VISIT NOTE  Subjective:    Patient ID: AZLYN PUGH, female    DOB: [birth year], 22 y.o.   MRN: [id number]  HPI  Patient is a 22 y.o. G90P0000 female who presents for fetal monitoring per order from Paula Compton, CNM.   Objective:    BP 105/72   Pulse (!) 116   Ht 5' (1.524 m)   LMP 12/25/2021 (Exact Date)   BMI 43.59 kg/m  Estimated Date of Delivery: 10/01/22  [redacted]w[redacted]d  Fetus A Non-Stress Test Interpretation for 09/17/22  Indication: Obesity  Fetal Heart Rate A Mode: External Baseline Rate (A): 135 bpm Variability: Moderate Accelerations: 15 x 15 Decelerations: None Multiple birth?: No  Uterine Activity Mode: Toco Contraction Frequency (min): None  Interpretation (Fetal Testing) Nonstress Test Interpretation: Reactive Overall Impression: Reassuring for gestational age   Assessment:   1. Obesity affecting pregnancy in third trimester, unspecified obesity type   2. [redacted] weeks gestation of pregnancy      Plan:   Results reviewed and discussed with patient by  Carie Caddy, CNM.     Rocco Serene, LPN

## 2022-09-17 NOTE — Patient Instructions (Signed)

## 2022-09-17 NOTE — Progress Notes (Signed)
Routine Prenatal Care Visit  Subjective  Laura Higgins is a 22 y.o. G1P0000 at [redacted]w[redacted]d being seen today for ongoing prenatal care.  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 Coli; Abnormal blood finding 03/13/22 Hgb fractionation (low HgbA2, elevated Hgb F); Positive urine drug screen  04/16/22 +MJ; [redacted] weeks gestation of pregnancy; and [redacted] weeks gestation of pregnancy on their problem list.  ----------------------------------------------------------------------------------- Patient reports  mood has been up and down, admits to feeling nervous about becoming a parent .  Her partner is excited, they have family for support.  -Has not had any signs of labor, reassured this is a normal experience at 38 wks -BMI 44, IOL 4/18 at midnight, Anticipatory guidance given, aware induction method is based on cervical exam, she can expect Cytotec and a sleeping aid at first, a foley balloon may be recommended.   Contractions: Not present. Vag. Bleeding: None.  Movement: Present. Leaking Fluid denies.  ----------------------------------------------------------------------------------- The following portions of the patient's history were reviewed and updated as appropriate: allergies, current medications, past family history, past medical history, past social history, past surgical history and problem list. Problem list updated.  Objective  Blood pressure 105/72, pulse (!) 116, weight 227 lb 12.8 oz (103.3 kg), last menstrual period 12/25/2021. Pregravid weight 184 lb (83.5 kg) Total Weight Gain 43 lb 12.8 oz (19.9 kg) Urinalysis: Urine  Protein    Urine Glucose    Fetal Status: Fetal Heart Rate (bpm): 135 (see NST) Fundal Height: 39 cm Movement: Present  Presentation: Vertex  General:  Alert, oriented and cooperative. Patient is in no acute distress.  Skin: Skin is warm and dry. No rash noted.   Cardiovascular: Normal heart rate noted  Respiratory: Normal respiratory effort, no problems with respiration noted  Abdomen: Soft, gravid, appropriate for gestational age. Pain/Pressure: Present     Pelvic:  Cervical exam performed Dilation: Closed Effacement (%): Thick Station: Ballotable  Extremities: Normal range of motion.  Edema: None  Mental Status: Normal mood and affect. Normal behavior. Normal judgment and thought content.   Assessment   22 y.o. G1P0000 at [redacted]w[redacted]d by  10/01/2022, by Ultrasound presenting for routine prenatal visit  Plan   first Problems (from 03/13/22 to present)     Problem Noted Resolved   Rubella non-immune status, antepartum 03/18/2022 by Jossie Ng, RN No   Overview Signed 03/18/2022 12:26 PM by Jossie Ng, RN    Rubella non-immune with hx of 2 documented MMRs MMR x1 post-partum      Supervision of high risk pregnancy, antepartum 03/13/2022 by Alberteen Spindle, CNM No   Overview Addendum 09/11/2022  3:33 PM by Fonda Kinder, CMA     Nursing Staff Provider  Office Location [facility]  On 02/10/22 @ 6 5/7  =  10/01/22  Language  English Anatomy US    Flu Vaccine  Declined 03/13/22 Genetic Screen  NIPS (MT21) 03/13/22  =  neg  AFP:    TDaP vaccine   07/16/22 Hgb A1C or  GTT Early: 03/13/22   =  116       (03/13/22) Third trimester: 142, normal 3hr gtt  COVID vaccine Declined 03/13/22    Rhogam  LAB RESULTS   Feeding Plan Formula  Blood Type   O positive                     (03/13/22)  Contraception Undecided - resource list given 03/13/22 Antibody  negative                         (03/13/22)  Circumcision  Rubella  MMR x2 (01/28/00, 10/17/03)  non-immune                     (03/13/22)  Pediatrician  Phineas Real RPR   Reactive                        (03/13/22)  Support Person Nagir HBsAg   negative                         (03/13/22)  Prenatal Classes  HIV   non-reactive                   (03/13/22)  @28wk - Doula referral?  Varicella Varivax x 2 (01/28/00; 11/28/06)    HCV   non-reactive                    (03/13/22)  BTL Consent  GBS  (For PCN allergy, check sensitivities)        VBAC Consent NA Pap  03/13/22  =   neg    Hgb Electro   Elevation in Hgb F, low Hgb A2            (03/13/22)  BP Cuff ordered  CF   Delivery Group [facility]   Centering Group  OBCM involved               Term labor symptoms and general obstetric precautions including but not limited to vaginal bleeding, contractions, leaking of fluid and fetal movement were reviewed in detail with the patient. Please refer to After Visit Summary for other counseling recommendations.   Return in about 1 week (around 09/24/2022) for ROB, NST .  IOL 4/18 at midnight, orders placed   Carie Caddy, [place]  [facility]  09/17/22  10:04 AM

## 2022-09-19 ENCOUNTER — Encounter: Payer: Self-pay | Admitting: Licensed Practical Nurse

## 2022-09-19 NOTE — Progress Notes (Signed)
  [facility] INDUCTION ASSESSMENT SCHEDULING Laura WEHRI 2001/05/12 Medical record #: [id number] Phone #:  Home Phone [phone]  Mobile [phone]    Prenatal Provider: [facility] Delivering Group: [facility] Proposed admission date/time:09/26/22 0001 Method of induction:Cytotec  Weight:  227 lbs  BMI  44.49 HIV Negative HSV Negative EDC Estimated Date of Delivery: 4/23/24based on:US at [redacted] wks  Gestational age on admission: 89 Gravidity/parity:G1P0000  Cervix Score   0 1 2 3   Position Posterior Midposition Anterior   Consistency Firm Medium Soft   Effacement (%) 0-30 40-50 60-70 >80  Dilation (cm) Closed 1-2 3-4 >5  Baby's station -3 -2 -1 +1, +2   Bishop Score:1    Medical induction of labor  select indication(s) below Elective induction ?39 weeks multiparous patient ?39 weeks primiparous patient with Bishop score ?7 ?40 weeks primiparous patient   Medical Indications Adapted from ACOG Committee Opinion #[id number], "Medically Indicated Late Preterm and Early Term Deliveries," 2013.  PLACENTAL / UTERINE ISSUES FETAL ISSUES MATERNAL ISSUES  Placenta previa (36.0-37.6) Isoimmunization (37.0-38.6) Preeclampsia without severe features or gestational HTN (37.0)  Suspected accreta (34.0-35.6) Growth Restriction ([place]) Preeclampsia with severe features (34.0)  Prior classical CD, uterine window, rupture (36.0-37.6) Isolated (38.0-39.6) Chronic HTN (38.0-39.6)  Prior myomectomy (37.0-38.6) Concurrent findings (34.0-37.6) Cholestasis (37.0)  Umbilical vein varix (37.0) Growth Restriction ([place]) Diabetes  Placental abruption (chronic) Di-Di Isolated (36.0-37.6) Pregestational, controlled (39.0)  OBSTETRIC ISSUES Di-Di concurrent findings (32.0-34.6) Pregestational, uncontrolled (37.0-39.0)  Postdates ? (41 weeks) Mo-Di isolated (32.0-34.6) Pregestational, vascular compromise (37.0- 39.0)  PPROM (34.0) Multiple Gestation Gestational, diet controlled (40.0)  Hx of  IUFD (39.0 weeks) Di-Di (38.0-38.6) Gestational, med controlled (39.0)  Polyhydramnios, mild/moderate; SDV 8-16 or AFI 25-35 (39.0) Mo-Di (36.0-37.6) Gestational, uncontrolled (38.0-39.0)  Oligohydramnios (36.0-37.6); MVP <2 cm  For indications not listed above, delivery recommendations from maternal-fetal medicine consultant occurred on: Date: with Dr.  for indication VF:IEPP BMI   Provider Signature: Laura Higgins Laura Higgins Scheduled by: Date:09/19/2022 1:30 PM   Call [phone] to finalize the induction date/time  [id number] (07/17)

## 2022-09-20 DIAGNOSIS — Z3A39 39 weeks gestation of pregnancy: Secondary | ICD-10-CM | POA: Insufficient documentation

## 2022-09-20 NOTE — Progress Notes (Signed)
    NURSE VISIT NOTE  Subjective:    Patient ID: Laura Higgins, female    DOB: [birth year], 22 y.o.   MRN: [id number]  HPI  Patient is a 22 y.o. G22P0000 female who presents for fetal monitoring per order from Carie Caddy, [place].   Objective:    LMP 12/25/2021 (Exact Date)  Estimated Date of Delivery: 10/01/22  [redacted]w[redacted]d  Fetus A Non-Stress Test Interpretation for 09/20/22  Indication: Obesity            Assessment:   1. Obesity affecting pregnancy in third trimester, unspecified obesity type   2. [redacted] weeks gestation of pregnancy      Plan:   Results reviewed and discussed with patient by  Hildred Laser, MD.     Rocco Serene, LPN

## 2022-09-20 NOTE — Patient Instructions (Signed)

## 2022-09-24 ENCOUNTER — Other Ambulatory Visit: Payer: BC Managed Care – PPO

## 2022-09-25 ENCOUNTER — Ambulatory Visit (INDEPENDENT_AMBULATORY_CARE_PROVIDER_SITE_OTHER): Payer: BC Managed Care – PPO | Admitting: Obstetrics and Gynecology

## 2022-09-25 ENCOUNTER — Encounter: Payer: Self-pay | Admitting: Obstetrics and Gynecology

## 2022-09-25 VITALS — BP 121/80 | HR 117 | Ht 60.0 in | Wt 226.4 lb

## 2022-09-25 VITALS — BP 121/80 | HR 117 | Wt 226.4 lb

## 2022-09-25 DIAGNOSIS — O99213 Obesity complicating pregnancy, third trimester: Secondary | ICD-10-CM

## 2022-09-25 DIAGNOSIS — E669 Obesity, unspecified: Secondary | ICD-10-CM

## 2022-09-25 DIAGNOSIS — O0990 Supervision of high risk pregnancy, unspecified, unspecified trimester: Secondary | ICD-10-CM

## 2022-09-25 DIAGNOSIS — Z3A39 39 weeks gestation of pregnancy: Secondary | ICD-10-CM | POA: Diagnosis not present

## 2022-09-25 NOTE — Progress Notes (Signed)
ROB [redacted]w[redacted]d: NST. Patient reports good fetal movement with braxton hicks contractions. Denies pain/pressure. Scheduled for IOL at midnight tonight.

## 2022-09-25 NOTE — Progress Notes (Signed)
ROB: Patient is a 22 y.o. G1P0000 at [redacted]w[redacted]d who presents for routine OB care.  Pregnancy is complicated by morbid obesity (BMI 44), h/o syphylis and trichomonas this pregnancy, UTI in pregnancy, marijuana use. Patient denies complaints.  Is scheduled for IOL tonight for BMI.  Has questions regarding induction. All questions answered. Also has questions regarding Pediatrician (notes she still has not selected one as of yet). Attempted to answer all questions. Cervical exam deferred today. NST performed today was reviewed and was found to be reactive.     NONSTRESS TEST INTERPRETATION  INDICATIONS: Obesity  FHR baseline: 135 bpm Accelerations: Present Decelerations:  Absent Variability: moderate Tocometer: uterine irritability RESULTS:Reactive COMMENTS: None   PLAN: 1. Continue fetal kick counts twice a day. 2. Scheduled for IOL at midnight.

## 2022-09-26 ENCOUNTER — Inpatient Hospital Stay: Payer: BC Managed Care – PPO | Admitting: Anesthesiology

## 2022-09-26 ENCOUNTER — Inpatient Hospital Stay
Admission: EM | Admit: 2022-09-26 | Discharge: 2022-09-29 | DRG: 807 | Disposition: A | Discharge status: Home / Self Care | Payer: BC Managed Care – PPO | Attending: Obstetrics | Admitting: Obstetrics

## 2022-09-26 ENCOUNTER — Encounter: Payer: Self-pay | Admitting: Obstetrics and Gynecology

## 2022-09-26 ENCOUNTER — Ambulatory Visit: Payer: Medicaid Other | Admitting: Licensed Clinical Social Worker

## 2022-09-26 ENCOUNTER — Other Ambulatory Visit: Payer: Self-pay

## 2022-09-26 DIAGNOSIS — A599 Trichomoniasis, unspecified: Secondary | ICD-10-CM | POA: Diagnosis not present

## 2022-09-26 DIAGNOSIS — O99214 Obesity complicating childbirth: Principal | ICD-10-CM | POA: Diagnosis present

## 2022-09-26 DIAGNOSIS — Z3A39 39 weeks gestation of pregnancy: Secondary | ICD-10-CM

## 2022-09-26 DIAGNOSIS — Z87891 Personal history of nicotine dependence: Secondary | ICD-10-CM

## 2022-09-26 DIAGNOSIS — Z23 Encounter for immunization: Secondary | ICD-10-CM

## 2022-09-26 DIAGNOSIS — F1290 Cannabis use, unspecified, uncomplicated: Secondary | ICD-10-CM | POA: Diagnosis not present

## 2022-09-26 DIAGNOSIS — Z7982 Long term (current) use of aspirin: Secondary | ICD-10-CM

## 2022-09-26 DIAGNOSIS — Z3490 Encounter for supervision of normal pregnancy, unspecified, unspecified trimester: Secondary | ICD-10-CM

## 2022-09-26 DIAGNOSIS — O99324 Drug use complicating childbirth: Secondary | ICD-10-CM | POA: Diagnosis not present

## 2022-09-26 DIAGNOSIS — A539 Syphilis, unspecified: Secondary | ICD-10-CM | POA: Diagnosis not present

## 2022-09-26 DIAGNOSIS — O9902 Anemia complicating childbirth: Secondary | ICD-10-CM | POA: Diagnosis present

## 2022-09-26 DIAGNOSIS — Z2839 Other underimmunization status: Principal | ICD-10-CM

## 2022-09-26 DIAGNOSIS — O9903 Anemia complicating the puerperium: Secondary | ICD-10-CM | POA: Diagnosis not present

## 2022-09-26 DIAGNOSIS — O99213 Obesity complicating pregnancy, third trimester: Secondary | ICD-10-CM | POA: Diagnosis not present

## 2022-09-26 DIAGNOSIS — O09899 Supervision of other high risk pregnancies, unspecified trimester: Secondary | ICD-10-CM

## 2022-09-26 DIAGNOSIS — O99323 Drug use complicating pregnancy, third trimester: Secondary | ICD-10-CM | POA: Diagnosis not present

## 2022-09-26 DIAGNOSIS — O0990 Supervision of high risk pregnancy, unspecified, unspecified trimester: Secondary | ICD-10-CM

## 2022-09-26 DIAGNOSIS — O98313 Other infections with a predominantly sexual mode of transmission complicating pregnancy, third trimester: Secondary | ICD-10-CM | POA: Diagnosis not present

## 2022-09-26 DIAGNOSIS — D62 Acute posthemorrhagic anemia: Secondary | ICD-10-CM | POA: Diagnosis not present

## 2022-09-26 HISTORY — DX: Encounter for supervision of normal pregnancy, unspecified, unspecified trimester: Z34.90

## 2022-09-26 LAB — TYPE AND SCREEN
ABO/RH(D): O POS
Antibody Screen: NEGATIVE

## 2022-09-26 LAB — ABO/RH: ABO/RH(D): O POS

## 2022-09-26 LAB — CBC
HCT: 36.1 % (ref 36.0–46.0)
Hemoglobin: 11.8 g/dL — ABNORMAL LOW (ref 12.0–15.0)
MCH: 25.2 pg — ABNORMAL LOW (ref 26.0–34.0)
MCHC: 32.7 g/dL (ref 30.0–36.0)
MCV: 77 fL — ABNORMAL LOW (ref 80.0–100.0)
Platelets: 235 10*3/uL (ref 150–400)
RBC: 4.69 MIL/uL (ref 3.87–5.11)
RDW: 17 % — ABNORMAL HIGH (ref 11.5–15.5)
WBC: 10.2 10*3/uL (ref 4.0–10.5)
nRBC: 0 % (ref 0.0–0.2)

## 2022-09-26 MED ORDER — DIPHENHYDRAMINE HCL 25 MG PO CAPS: 50.0000 mg | ORAL_CAPSULE | Freq: Every evening | ORAL | Status: DC | PRN

## 2022-09-26 MED ORDER — EPHEDRINE 5 MG/ML INJ
10.0000 mg | INTRAVENOUS | Status: DC | PRN
  Administered 2022-09-26: 10 mg via INTRAVENOUS

## 2022-09-26 MED ORDER — ONDANSETRON HCL 4 MG/2ML IJ SOLN
4.0000 mg | Freq: Four times a day (QID) | INTRAMUSCULAR | Status: DC | PRN
  Administered 2022-09-26 – 2022-09-27 (×3): 4 mg via INTRAVENOUS
  Filled 2022-09-26 (×3): qty 2

## 2022-09-26 MED ORDER — FENTANYL-BUPIVACAINE-NACL 0.5-0.125-0.9 MG/250ML-% EP SOLN
12.0000 mL/h | EPIDURAL | Status: DC | PRN
  Administered 2022-09-26: 12 mL/h via EPIDURAL
  Filled 2022-09-26: qty 250

## 2022-09-26 MED ORDER — EPHEDRINE 5 MG/ML INJ
10.0000 mg | INTRAVENOUS | Status: DC | PRN
  Filled 2022-09-26: qty 5

## 2022-09-26 MED ORDER — MISOPROSTOL 200 MCG PO TABS
ORAL_TABLET | ORAL | Status: AC
  Filled 2022-09-26: qty 4

## 2022-09-26 MED ORDER — OXYTOCIN-SODIUM CHLORIDE 30-0.9 UT/500ML-% IV SOLN
1.0000 m[IU]/min | INTRAVENOUS | Status: DC
  Administered 2022-09-26: 2 m[IU]/min via INTRAVENOUS
  Filled 2022-09-26: qty 500

## 2022-09-26 MED ORDER — LACTATED RINGERS IV SOLN
500.0000 mL | INTRAVENOUS | Status: DC | PRN
  Administered 2022-09-26 (×3): 500 mL via INTRAVENOUS
  Administered 2022-09-27: 1000 mL via INTRAVENOUS

## 2022-09-26 MED ORDER — PHENYLEPHRINE 80 MCG/ML (10ML) SYRINGE FOR IV PUSH (FOR BLOOD PRESSURE SUPPORT)
80.0000 ug | PREFILLED_SYRINGE | INTRAVENOUS | Status: AC | PRN
  Administered 2022-09-26 (×3): 80 ug via INTRAVENOUS

## 2022-09-26 MED ORDER — LACTATED RINGERS IV SOLN: 500.0000 mL | Freq: Once | INTRAVENOUS | Status: DC

## 2022-09-26 MED ORDER — FENTANYL-BUPIVACAINE-NACL 0.5-0.125-0.9 MG/250ML-% EP SOLN
EPIDURAL | Status: AC
  Administered 2022-09-27: 12 mL/h via EPIDURAL
  Filled 2022-09-26: qty 250

## 2022-09-26 MED ORDER — OXYTOCIN-SODIUM CHLORIDE 30-0.9 UT/500ML-% IV SOLN
2.5000 [IU]/h | INTRAVENOUS | Status: DC
  Filled 2022-09-26: qty 500

## 2022-09-26 MED ORDER — MISOPROSTOL 50MCG HALF TABLET: 50.0000 ug | ORAL_TABLET | ORAL | Status: DC | PRN

## 2022-09-26 MED ORDER — LIDOCAINE HCL (PF) 1 % IJ SOLN
INTRAMUSCULAR | Status: DC | PRN
  Administered 2022-09-26: 2 mL via INTRADERMAL

## 2022-09-26 MED ORDER — LIDOCAINE-EPINEPHRINE (PF) 1.5 %-1:200000 IJ SOLN
INTRAMUSCULAR | Status: DC | PRN
  Administered 2022-09-26: 3 mL via EPIDURAL

## 2022-09-26 MED ORDER — FENTANYL CITRATE (PF) 100 MCG/2ML IJ SOLN
50.0000 ug | INTRAMUSCULAR | Status: DC | PRN
  Administered 2022-09-26 (×3): 100 ug via INTRAVENOUS
  Filled 2022-09-26 (×3): qty 2

## 2022-09-26 MED ORDER — MISOPROSTOL 25 MCG QUARTER TABLET
50.0000 ug | ORAL_TABLET | ORAL | Status: DC | PRN
  Administered 2022-09-26: 50 ug via ORAL
  Filled 2022-09-26: qty 1

## 2022-09-26 MED ORDER — MISOPROSTOL 25 MCG QUARTER TABLET
25.0000 ug | ORAL_TABLET | Freq: Once | ORAL | Status: AC
  Administered 2022-09-26: 25 ug via ORAL
  Filled 2022-09-26: qty 1

## 2022-09-26 MED ORDER — TERBUTALINE SULFATE 1 MG/ML IJ SOLN
0.2500 mg | Freq: Once | INTRAMUSCULAR | Status: AC | PRN
  Administered 2022-09-26: 0.25 mg via SUBCUTANEOUS
  Filled 2022-09-26: qty 1

## 2022-09-26 MED ORDER — BUPIVACAINE HCL (PF) 0.25 % IJ SOLN
INTRAMUSCULAR | Status: DC | PRN
  Administered 2022-09-26 (×2): 4 mL via EPIDURAL

## 2022-09-26 MED ORDER — DIPHENHYDRAMINE HCL 50 MG/ML IJ SOLN
12.5000 mg | INTRAMUSCULAR | Status: DC | PRN
  Administered 2022-09-27: 12.5 mg via INTRAVENOUS
  Filled 2022-09-26: qty 1

## 2022-09-26 MED ORDER — OXYTOCIN BOLUS FROM INFUSION
333.0000 mL | Freq: Once | INTRAVENOUS | Status: AC
  Administered 2022-09-27: 333 mL via INTRAVENOUS

## 2022-09-26 MED ORDER — SOD CITRATE-CITRIC ACID 500-334 MG/5ML PO SOLN: 30.0000 mL | ORAL | Status: DC | PRN

## 2022-09-26 MED ORDER — AMMONIA AROMATIC IN INHA
RESPIRATORY_TRACT | Status: AC
  Filled 2022-09-26: qty 10

## 2022-09-26 MED ORDER — LIDOCAINE HCL (PF) 1 % IJ SOLN
30.0000 mL | INTRAMUSCULAR | Status: DC | PRN
  Filled 2022-09-26: qty 30

## 2022-09-26 MED ORDER — LACTATED RINGERS IV SOLN: INTRAVENOUS | Status: DC

## 2022-09-26 MED ORDER — MISOPROSTOL 50MCG HALF TABLET
50.0000 ug | ORAL_TABLET | Freq: Once | ORAL | Status: AC
  Administered 2022-09-26: 50 ug via VAGINAL
  Filled 2022-09-26: qty 1

## 2022-09-26 MED ORDER — PHENYLEPHRINE 80 MCG/ML (10ML) SYRINGE FOR IV PUSH (FOR BLOOD PRESSURE SUPPORT)
80.0000 ug | PREFILLED_SYRINGE | INTRAVENOUS | Status: DC | PRN
  Filled 2022-09-26: qty 10

## 2022-09-26 MED ORDER — OXYTOCIN 10 UNIT/ML IJ SOLN
INTRAMUSCULAR | Status: AC
  Filled 2022-09-26: qty 2

## 2022-09-26 NOTE — Progress Notes (Signed)
LABOR NOTE   SUBJECTIVE:   Laura Higgins is a 22 y.o.  G1P0000  at [redacted]w[redacted]d whose labor is being induced for elevated BMI. She is comfortable with her epidural. She is receiving Pitocin at 4 mU/min. She continues to be hypotensive after fluids, ephedrine, and phenylephrine. Per anesthesia, epidural will be turned down.  Analgesia: Epidural  OBJECTIVE:  BP (!) 95/31   Pulse (!) 118   Temp 98 F (36.7 C) (Oral)   Resp 16   Ht 5' (1.524 m)   Wt 102.7 kg   LMP 12/25/2021 (Exact Date)   SpO2 98%   BMI 44.22 kg/m  Total I/O In: -  Out: 350 [Urine:350]  SVE:   Dilation: 3 Effacement (%): 60 Station: -2 Exam by:: Laura Higgins CNM CONTRACTIONS: regular, every 2-5 minutes FHR: Fetal heart tracing reviewed. Baseline: 145 Variability: moderate Accelerations: present Decelerations:none Category 1  Labs: Lab Results  Component Value Date   WBC 10.2 09/26/2022   HGB 11.8 (L) 09/26/2022   HCT 36.1 09/26/2022   MCV 77.0 (L) 09/26/2022   PLT 235 09/26/2022    ASSESSMENT:     Induction of labor, making appropriate progress     Coping well     Membranes: ruptured, clear fluid     Principal Problem:   Encounter for induction of labor   PLAN: Continue to titrate Pitocin Anticipate NSVD  Laura Higgins, CNM 09/26/2022 9:03 PM

## 2022-09-26 NOTE — Anesthesia Procedure Notes (Signed)
Epidural Patient location during procedure: OB  Staffing Resident/CRNA: Mathews Argyle, CRNA Performed: resident/CRNA   Preanesthetic Checklist Completed: patient identified, IV checked, site marked, risks and benefits discussed, surgical consent, monitors and equipment checked, pre-op evaluation and timeout performed  Epidural Patient position: sitting Prep: ChloraPrep and site prepped and draped Patient monitoring: heart rate, continuous pulse ox and blood pressure Approach: midline Location: L4-L5 Injection technique: LOR saline  Needle:  Needle type: Tuohy  Needle gauge: 17 G Needle length: 9 cm and 9 Needle insertion depth: 8 cm Catheter type: closed end flexible Catheter size: 19 Gauge Catheter at skin depth: 13 cm Test dose: negative and 1.5% lidocaine with Epi 1:200 K  Assessment Events: blood not aspirated, no cerebrospinal fluid, injection not painful, no injection resistance, no paresthesia and negative IV test  Additional Notes   Patient tolerated the insertion well without complications.Reason for block:procedure for pain

## 2022-09-26 NOTE — Progress Notes (Signed)
LABOR NOTE   SUBJECTIVE:   Laura Higgins is a 22 y.o.  G1P0000  at [redacted]w[redacted]d whose labor is being induced for elevated BMI. She has received 2 doses of misoprostol. Her Cook catheter is still in place. She is having regular, painful contractions  Analgesia: Labor support without medications  OBJECTIVE:  BP 122/68   Pulse (!) 103   Temp 97.8 F (36.6 C) (Oral)   Resp 18   Ht 5' (1.524 m)   Wt 102.7 kg   LMP 12/25/2021 (Exact Date)   BMI 44.22 kg/m  No intake/output data recorded.  SVE:   Dilation: 1 Effacement (%): Thick Station: Ballotable Exam by:: [place] Guptill RN CONTRACTIONS: regular, every 2-3 minutes FHR: Fetal heart tracing reviewed. Baseline: 125 Variability: moderate Accelerations: present Decelerations:none Category 1  Labs: Lab Results  Component Value Date   WBC 10.2 09/26/2022   HGB 11.8 (L) 09/26/2022   HCT 36.1 09/26/2022   MCV 77.0 (L) 09/26/2022   PLT 235 09/26/2022    ASSESSMENT:      Cervical ripening     Coping well     Membranes: intact     Cook catheter in place Principal Problem:   Encounter for induction of labor   PLAN: Continue cervical ripening Epidural when desired Anticipate NSVD  [name], CNM 09/26/2022 3:30 PM

## 2022-09-26 NOTE — Progress Notes (Signed)
LABOR NOTE   SUBJECTIVE:   Laura Higgins is a 22 y.o.  G1P0000  at [redacted]w[redacted]d whose labor is being induced for elevated BMI. She is currently receiving 10 mU/min of Pitocin. She is comfortable with her epidural. She has made cervical change. Molding is noted on exam.  Analgesia: Epidural  OBJECTIVE:  BP (!) 100/55   Pulse (!) 112   Temp 98.6 F (37 C) (Oral)   Resp 16   Ht 5' (1.524 m)   Wt 102.7 kg   LMP 12/25/2021 (Exact Date)   SpO2 96%   BMI 44.22 kg/m  Total I/O In: -  Out: 350 [Urine:350]  SVE:   Dilation: 4 Effacement (%): 70 Station: 0 Exam by:: Missy Cole Eastridge CNM CONTRACTIONS: regular, every 2-4 minutes FHR: Fetal heart tracing reviewed. Baseline: 135 Variability: moderate Accelerations: present Decelerations:early Category 1  Labs: Lab Results  Component Value Date   WBC 10.2 09/26/2022   HGB 11.8 (L) 09/26/2022   HCT 36.1 09/26/2022   MCV 77.0 (L) 09/26/2022   PLT 235 09/26/2022    ASSESSMENT:     Induction of labor due to elevated BMI,  progressing well on pitocin     Coping well     Membranes: ruptured, clear fluid   Principal Problem:   Encounter for induction of labor   PLAN: Frequent position changes Continue to titrate Pitocin for cervical change Anticipate NSVD   Laura Higgins, CNM 09/26/2022 11:22 PM

## 2022-09-26 NOTE — H&P (Signed)
History and Physical   HPI  Laura Higgins is a 22 y.o. G1P0000 at [redacted]w[redacted]d Estimated Date of Delivery: 10/01/22 who is being admitted for induction of labor BMI>40. 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 She endorses good fetal movement. She denies LOF and vaginal bleeding. She has received one dose of misoprostol and is feeling some cramps. We discussed placement of a Cook catheter, and she is in agreement with that plan.   OB History  OB History  Gravida Para Term Preterm AB Living  1 0 0 0 0 0  SAB IAB Ectopic Multiple Live Births  0 0 0 0 0    # Outcome Date GA Lbr Len/2nd Weight Sex Delivery Anes PTL Lv  1 Current             PROBLEM LIST  Pregnancy complications or risks: Patient Active Problem List   Diagnosis Date Noted   Encounter for induction of labor 09/26/2022   [redacted] weeks gestation of pregnancy 09/20/2022   [redacted] weeks gestation of pregnancy 09/17/2022   [redacted] weeks gestation of pregnancy 09/10/2022   Positive urine drug screen  04/16/22 +MJ 04/22/2022   Syphilis affecting pregnancy 03/13/22 RPR 1:64 03/18/2022   Rubella non-immune status, antepartum 03/18/2022   UTI (urinary tract infection) during pregnancy 03/13/22 E. Coli 03/18/2022   Abnormal blood finding 03/13/22 Hgb fractionation (low HgbA2, elevated Hgb F) 03/18/2022   Supervision of high risk pregnancy, antepartum 03/13/2022   cutter age 1 03/13/2022   Marijuana use daily 11/10/2019   Trichomonas infection 03/13/22 11/10/2019   Smoker Black & Milds 2/day 08/04/2019   Obesity affecting pregnancy in third trimester 06/13/2015    Prenatal labs and studies: ABO, Rh: --/--/O POS Performed at [facility], [address] [place], [place] [address]  [phone]) Antibody: NEG (04/18 0534) Rubella: <0.90 (10/04 1035) RPR: Reactive (01/23 1017)  HBsAg: Negative (10/04 1035)  HIV: Non Reactive (01/23 1017)  AOZ:HYQMVHQI/-- (03/20 1049)   Past Medical  History:  Diagnosis Date   Chlamydia infection 01/2020   Chlamydia infection 08/2019   Encounter for induction of labor 09/26/2022   Trichomonas infection 01/2020     Past Surgical History:  Procedure Laterality Date   NO PAST SURGERIES       Medications    Current Discharge Medication List     CONTINUE these medications which have NOT CHANGED   Details  Prenatal Vit-Fe Fumarate-FA (MULTIVITAMIN-PRENATAL) 27-0.8 MG TABS tablet Take 1 tablet by mouth daily at 12 noon.    aspirin 81 MG chewable tablet Chew 81 mg by mouth daily.         Allergies  Shellfish allergy  Review of Systems  Constitutional: negative Respiratory: negative Cardiovascular: negative Gastrointestinal: negative Genitourinary:negative Musculoskeletal:negative Behavioral/Psych: negative  Physical Exam  BP 135/87 (BP Location: Right Arm)   Pulse (!) 127   Temp 98.2 F (36.8 C) (Oral)   Resp 18   Ht 5' (1.524 m)   Wt 102.7 kg   LMP 12/25/2021 (Exact Date)   BMI 44.22 kg/m   General: alert, cooperative, NAD Lungs:  CTAB Cardio: RRR without M/R/G Abd: Soft, gravid, NT Presentation: cephalic  CERVIX: Dilation: 1 Effacement (%): Thick Cervical Position: Posterior Station: Ballotable Presentation: Vertex Exam by:: [place] Guptill RN  See Prenatal records for more detailed PE.     FHR:  Baseline: 125 Variability: moderate Accelerations: present Decelerations: none Toco: irregular Category 1  Test Results  Results for orders  placed or performed during the hospital encounter of 09/26/22 (from the past 24 hour(s))  CBC     Status: Abnormal   Collection Time: 09/26/22  5:34 AM  Result Value Ref Range   WBC 10.2 4.0 - 10.5 K/uL   RBC 4.69 3.87 - 5.11 MIL/uL   Hemoglobin 11.8 (L) 12.0 - 15.0 g/dL   HCT 16.1 09.6 - 04.5 %   MCV 77.0 (L) 80.0 - 100.0 fL   MCH 25.2 (L) 26.0 - 34.0 pg   MCHC 32.7 30.0 - 36.0 g/dL   RDW 40.9 (H) 81.1 - 91.4 %   Platelets 235 150 - 400 K/uL    nRBC 0.0 0.0 - 0.2 %  Type and screen     Status: None   Collection Time: 09/26/22  5:34 AM  Result Value Ref Range   ABO/RH(D) O POS    Antibody Screen NEG    Sample Expiration      09/29/2022,2359 Performed at [facility], [address], [place], [place] [address]   ABO/Rh     Status: None   Collection Time: 09/26/22  7:21 AM  Result Value Ref Range   ABO/RH(D)      O POS Performed at [facility], [address] [place], [place] [address]    Group B Strep negative  Assessment   G1P0000 at [redacted]w[redacted]d Estimated Date of Delivery: 10/01/22  Reassuring maternal/fetal status.  Patient Active Problem List   Diagnosis Date Noted   Encounter for induction of labor 09/26/2022   [redacted] weeks gestation of pregnancy 09/20/2022   [redacted] weeks gestation of pregnancy 09/17/2022   [redacted] weeks gestation of pregnancy 09/10/2022   Positive urine drug screen  04/16/22 +MJ 04/22/2022   Syphilis affecting pregnancy 03/13/22 RPR 1:64 03/18/2022   Rubella non-immune status, antepartum 03/18/2022   UTI (urinary tract infection) during pregnancy 03/13/22 E. Coli 03/18/2022   Abnormal blood finding 03/13/22 Hgb fractionation (low HgbA2, elevated Hgb F) 03/18/2022   Supervision of high risk pregnancy, antepartum 03/13/2022   cutter age 62 03/13/2022   Marijuana use daily 11/10/2019   Trichomonas infection 03/13/22 11/10/2019   Smoker Black & Milds 2/day 08/04/2019   Obesity affecting pregnancy in third trimester 06/13/2015    Plan  1. Admit to L&D for cervical ripening and induction 2. EFM per protocol  -- Category 1 3. Pharmacologic pain relief if desired.   4. Admission labs  5. Cook catheter placed. Dose misoprostol as appropriate  6. Anticipate NSVD 7. Dr. Valentino Saxon notified of admission  [name], [facility] 09/26/2022 9:51 AM

## 2022-09-26 NOTE — Anesthesia Preprocedure Evaluation (Signed)
Anesthesia Evaluation  Patient identified by MRN, date of birth, ID band Patient awake    Reviewed: Allergy & Precautions, H&P , NPO status , Patient's Chart, lab work & pertinent test results  Airway Mallampati: III  TM Distance: >3 FB Neck ROM: full    Dental   Pulmonary former smoker          Cardiovascular negative cardio ROS      Neuro/Psych negative neurological ROS  negative psych ROS   GI/Hepatic Neg liver ROS,GERD  ,,  Endo/Other  negative endocrine ROS    Renal/GU negative Renal ROS  negative genitourinary   Musculoskeletal   Abdominal   Peds  Hematology negative hematology ROS (+)   Anesthesia Other Findings   Reproductive/Obstetrics (+) Pregnancy                             Anesthesia Physical Anesthesia Plan  ASA: 2  Anesthesia Plan: Epidural   Post-op Pain Management:    Induction:   PONV Risk Score and Plan:   Airway Management Planned:   Additional Equipment:   Intra-op Plan:   Post-operative Plan:   Informed Consent: I have reviewed the patients History and Physical, chart, labs and discussed the procedure including the risks, benefits and alternatives for the proposed anesthesia with the patient or authorized representative who has indicated his/her understanding and acceptance.       Plan Discussed with: CRNA  Anesthesia Plan Comments:        Anesthesia Quick Evaluation

## 2022-09-26 NOTE — Progress Notes (Signed)
OB/GYN Attending Progress Note  Called from home to assess patent due to prolonged deceleration lasting 6 minutes from baseline 120s down to 60s at 1854. Recently received epidural. Upon my arrival at 1915, fetal heart tracing had returned to normal, with baseline 160s and accels present.  Patient received 1 dose of ephedrine as was about to receive 1 dose of phenylephrine.  BPs at lowest 60s/40s, currently at 100/27 upon my arrival.  Reiterated recent events with patient and family, midwife [name] also present in room. Patient changed from left lateral to right lateral positioning. Foley bulb tugged to assess for descent, expulsion noted, cervix 3/60/-2 with SROM (clear fluid). Once further stable, can continue IOL.    Hildred Laser, MD [facility] at [facility]

## 2022-09-26 NOTE — Progress Notes (Signed)
Laura Higgins is a 22 y.o. G1P0000 presenting to L&D for scheduled induction. Discussed induction and evaluated her understanding of the process. Laura Higgins verbalized understanding of why she is being induced and agreed to start the process. She reports positive fetal movement and denies vaginal bleeding, leaking of fluid, and regular contractions. We discussed her birth preferences, including an epidural. Provided education on what to expect with misoprostol. Initial vital signs stable. Fetal monitors applied and education given. Laura Higgins has been oriented to care environment, including call bell and bed control use. Explained the admission packet, including birth certificate worksheet, Laura Higgins Assessment, and feeding log. Her mom, Laura Higgins, FOB Laura Higgins, and Laura Higgins are at bedside for labor support. Laura Higgins is resting comfortably after a well tolerated cervical exam and misoprostol placement. Plan to encourage rest, assist with position changes to encourage fetal engagement as labor progresses, and reassess cervix in 4 hours.

## 2022-09-26 NOTE — Progress Notes (Signed)
LABOR NOTE   SUBJECTIVE:   Laura Higgins is a 22 y.o.  G1P0000  at [redacted]w[redacted]d whose labor is being induced for elevated BMI. She is comfortable with her epidural. She was noted to have prolonged decelerations and hypotension after epidural placement. She was given a fluid bolus, terbutaline, and ephedrine in  addition to repositioning. Dr. Valentino Higgins was called to the bedside. FHR recovered. Cook catheter was expelled and SROM with clear fluid noted. SVE was 3/60/-2 at this time.   Analgesia: Epidural  OBJECTIVE:  BP (!) 95/31   Pulse (!) 118   Temp 98 F (36.7 C) (Oral)   Resp 16   Ht 5' (1.524 m)   Wt 102.7 kg   LMP 12/25/2021 (Exact Date)   SpO2 98%   BMI 44.22 kg/m  No intake/output data recorded.  SVE:   Dilation: 3 Effacement (%): 60 Station: -2 Exam by:: Occupational hygienist CNM CONTRACTIONS: regular, every 1.5-2 minutes FHR: Fetal heart tracing reviewed. Baseline: 135 Variability: moderate, minimal Accelerations: none Decelerations:late and prolonged Category 2  Labs: Lab Results  Component Value Date   WBC 10.2 09/26/2022   HGB 11.8 (L) 09/26/2022   HCT 36.1 09/26/2022   MCV 77.0 (L) 09/26/2022   PLT 235 09/26/2022    ASSESSMENT:      Induction of labor for BMI , making appropriate progress      Prolonged decel, recovered     Coping well     Membranes: ruptured, clear fluid  Principal Problem:   Encounter for induction of labor   PLAN: Start Pitocin augmentation when stable Anticipate NSVD Dr. Valentino Higgins aware of plan (see her note)  Laura Higgins, CNM 09/26/2022 7:40 PM

## 2022-09-27 ENCOUNTER — Encounter: Payer: Self-pay | Admitting: Obstetrics and Gynecology

## 2022-09-27 DIAGNOSIS — O98313 Other infections with a predominantly sexual mode of transmission complicating pregnancy, third trimester: Secondary | ICD-10-CM

## 2022-09-27 DIAGNOSIS — D62 Acute posthemorrhagic anemia: Secondary | ICD-10-CM

## 2022-09-27 DIAGNOSIS — O99324 Drug use complicating childbirth: Secondary | ICD-10-CM

## 2022-09-27 DIAGNOSIS — A599 Trichomoniasis, unspecified: Secondary | ICD-10-CM

## 2022-09-27 DIAGNOSIS — A539 Syphilis, unspecified: Secondary | ICD-10-CM

## 2022-09-27 DIAGNOSIS — F1290 Cannabis use, unspecified, uncomplicated: Secondary | ICD-10-CM

## 2022-09-27 DIAGNOSIS — O99214 Obesity complicating childbirth: Principal | ICD-10-CM

## 2022-09-27 DIAGNOSIS — Z3A39 39 weeks gestation of pregnancy: Secondary | ICD-10-CM

## 2022-09-27 DIAGNOSIS — O9903 Anemia complicating the puerperium: Secondary | ICD-10-CM

## 2022-09-27 LAB — RPR
RPR Ser Ql: REACTIVE — AB
RPR Titer: 1:16 {titer}

## 2022-09-27 MED ORDER — DIPHENHYDRAMINE HCL 25 MG PO CAPS: 25.0000 mg | ORAL_CAPSULE | Freq: Four times a day (QID) | ORAL | Status: DC | PRN

## 2022-09-27 MED ORDER — IBUPROFEN 600 MG PO TABS
600.0000 mg | ORAL_TABLET | Freq: Four times a day (QID) | ORAL | Status: DC
  Administered 2022-09-27 – 2022-09-29 (×9): 600 mg via ORAL
  Filled 2022-09-27 (×9): qty 1

## 2022-09-27 MED ORDER — SENNOSIDES-DOCUSATE SODIUM 8.6-50 MG PO TABS
2.0000 | ORAL_TABLET | Freq: Every day | ORAL | Status: DC
  Administered 2022-09-28 – 2022-09-29 (×2): 2 via ORAL
  Filled 2022-09-27 (×2): qty 2

## 2022-09-27 MED ORDER — OXYCODONE-ACETAMINOPHEN 5-325 MG PO TABS
2.0000 | ORAL_TABLET | ORAL | Status: DC | PRN
  Administered 2022-09-27 (×2): 2 via ORAL
  Filled 2022-09-27 (×2): qty 2

## 2022-09-27 MED ORDER — ONDANSETRON HCL 4 MG/2ML IJ SOLN: 4.0000 mg | INTRAMUSCULAR | Status: DC | PRN

## 2022-09-27 MED ORDER — TETANUS-DIPHTH-ACELL PERTUSSIS 5-2.5-18.5 LF-MCG/0.5 IM SUSY: 0.5000 mL | PREFILLED_SYRINGE | Freq: Once | INTRAMUSCULAR | Status: DC

## 2022-09-27 MED ORDER — PRENATAL MULTIVITAMIN CH
1.0000 | ORAL_TABLET | Freq: Every day | ORAL | Status: DC
  Administered 2022-09-28 – 2022-09-29 (×2): 1 via ORAL
  Filled 2022-09-27 (×2): qty 1

## 2022-09-27 MED ORDER — WITCH HAZEL-GLYCERIN EX PADS: 1.0000 | MEDICATED_PAD | CUTANEOUS | Status: DC | PRN

## 2022-09-27 MED ORDER — ONDANSETRON HCL 4 MG PO TABS: 4.0000 mg | ORAL_TABLET | ORAL | Status: DC | PRN

## 2022-09-27 MED ORDER — BENZOCAINE-MENTHOL 20-0.5 % EX AERO: 1.0000 | INHALATION_SPRAY | CUTANEOUS | Status: DC | PRN

## 2022-09-27 MED ORDER — DIBUCAINE (PERIANAL) 1 % EX OINT: 1.0000 | TOPICAL_OINTMENT | CUTANEOUS | Status: DC | PRN

## 2022-09-27 MED ORDER — ZOLPIDEM TARTRATE 5 MG PO TABS: 5.0000 mg | ORAL_TABLET | Freq: Every evening | ORAL | Status: DC | PRN

## 2022-09-27 MED ORDER — OXYCODONE-ACETAMINOPHEN 5-325 MG PO TABS
1.0000 | ORAL_TABLET | ORAL | Status: DC | PRN
  Administered 2022-09-28: 1 via ORAL
  Filled 2022-09-27: qty 1

## 2022-09-27 MED ORDER — SIMETHICONE 80 MG PO CHEW: 80.0000 mg | CHEWABLE_TABLET | ORAL | Status: DC | PRN

## 2022-09-27 MED ORDER — COCONUT OIL OIL: 1.0000 | TOPICAL_OIL | Status: DC | PRN

## 2022-09-27 MED ORDER — ACETAMINOPHEN 325 MG PO TABS
650.0000 mg | ORAL_TABLET | ORAL | Status: DC | PRN
  Filled 2022-09-27: qty 2

## 2022-09-27 NOTE — Progress Notes (Signed)
LABOR NOTE   SUBJECTIVE:   Laura Higgins is a 22 y.o.  G1P0000  at [redacted]w[redacted]d whose labor is being induced for elevated BMI. She is receiving Pitocin 20 mU/min. She is feeling some back pain and has had her epidural adjusted. She has made cervical change and the head is better applied to the cervix.  Analgesia: Epidural  OBJECTIVE:  BP (!) 119/51   Pulse (!) 126   Temp 98.6 F (37 C) (Oral)   Resp 14   Ht 5' (1.524 m)   Wt 102.7 kg   LMP 12/25/2021 (Exact Date)   SpO2 99%   BMI 44.22 kg/m  Total I/O In: 1293.6 [I.V.:1247.8; Other:45.8] Out: 700 [Urine:700]  SVE:   Dilation: 5 Effacement (%): 90 Station: Plus 1 Exam by:: Missy Markis Langland CNM CONTRACTIONS: regular, every 2-4 minutes FHR: Fetal heart tracing reviewed. Baseline:  135 Variability: moderate, periods of minimal Accelerations: present Decelerations: late, variable, and early Category 2  Labs: Lab Results  Component Value Date   WBC 10.2 09/26/2022   HGB 11.8 (L) 09/26/2022   HCT 36.1 09/26/2022   MCV 77.0 (L) 09/26/2022   PLT 235 09/26/2022    ASSESSMENT:     Induction of labor due to elevated BMI,  progressing well on pitocin     Coping well     Membranes: ruptured     Principal Problem:   Encounter for induction of labor   PLAN: Continue present management Frequent position changes Anticipate NSVD  [name], CNM 09/27/2022 2:03 AM

## 2022-09-27 NOTE — Progress Notes (Signed)
LABOR NOTE   SUBJECTIVE:   Laura Higgins is a 22 y.o.  G1P0000  at [redacted]w[redacted]d whose labor is being induced for elevated BMI. She is receiving Pitocin 20 mU/min. She is beginning to feel pressure with contractions. She has made cervical change since her previous exam. The anterior cervix is slightly swollen.  Analgesia: Epidural  OBJECTIVE:  BP 125/74   Pulse (!) 129   Temp 98.8 F (37.1 C) (Axillary)   Resp 14   Ht 5' (1.524 m)   Wt 102.7 kg   LMP 12/25/2021 (Exact Date)   SpO2 97%   BMI 44.22 kg/m  Total I/O In: 1293.6 [I.V.:1247.8; Other:45.8] Out: 700 [Urine:700]  SVE:   Dilation: 8 Effacement (%): 90 Station: Plus 1 Exam by:: Quitman Livings, CNM CONTRACTIONS: regular, every 2-4 minutes FHR: Fetal heart tracing reviewed. Baseline: 135 Variability: moderate Accelerations: Decelerations:late Category 2  Labs: Lab Results  Component Value Date   WBC 10.2 09/26/2022   HGB 11.8 (L) 09/26/2022   HCT 36.1 09/26/2022   MCV 77.0 (L) 09/26/2022   PLT 235 09/26/2022    ASSESSMENT: 1) Induction of labor due to elevated BMI,  progressing well on pitocin     Coping well     Membranes: ruptured, clear fluid  Principal Problem:   Encounter for induction of labor   PLAN: Position changes to promote cervical dilation Anticipate NSVD  [name], CNM 09/27/2022 4:08 AM

## 2022-09-27 NOTE — Progress Notes (Signed)
  Labor Progress Note   22 y.o. G1P0000 @ [redacted]w[redacted]d   Subjective:  Resting comfortably with epidural.  Objective:  BP (!) 103/59   Pulse (!) 115   Temp 98.3 F (36.8 C) (Oral)   Resp (!) 23   Ht 5' (1.524 m)   Wt 102.7 kg   LMP 12/25/2021 (Exact Date)   SpO2 97%   BMI 44.22 kg/m  Abd: gravid SVE: able to palpate small amount of cervix on patient's left side only  EFM: 125, min to mod variability, pos accels, rare and isolated late decels that quickly recover Toco:  Ucs q2-31min, Pit at 80mu/min   Assessment  G1P0000 @ [redacted]w[redacted]d transition Maternal tachycardia FHR Cat II but overall reassuring   Plan:   1. Dr. Logan Bores updated on patient status and plan. Dr. Logan Bores would like [place] to continue monitoring maternal tachycardia but no interventions at this time 2. Plan to recheck cervix within 30-60min and anticipate SVE soon  All discussed with patient  Laura Higgins CNM, FNP [facility] 09/27/2022  2:37 PM

## 2022-09-27 NOTE — Progress Notes (Signed)
SVD baby boy, skin to skin with mother 

## 2022-09-27 NOTE — Progress Notes (Signed)
First push, CNM at [facility], education given, pt pushing with good effort. Labial movement noted while pushing.

## 2022-09-27 NOTE — Progress Notes (Signed)
  Labor Progress Note   22 y.o. G1P0000 @ [redacted]w[redacted]d. IOL for BMI of 41. Received two doses of cytotec, cooks catheter and pitocin.   Subjective:  Has been napping on/off with epidural although also states she can feel her contractions.  Objective:  BP 115/83   Pulse (!) 129   Temp 97.7 F (36.5 C) (Oral)   Resp (!) 21   Ht 5' (1.524 m)   Wt 102.7 kg   LMP 12/25/2021 (Exact Date)   SpO2 96%   BMI 44.22 kg/m  Abd: gravid SVE:  6cm/75%/-1 per RN exam  EFM: 130, mod variability, pos accels, no decels Toco: Ucs q2-42min, Pit at 20mu/min   Assessment  G1P0000 @ [redacted]w[redacted]d Active labor Maternal tachycardia FHR Cat I   Plan:   1. Spoke with Dr. Valentino Saxon regarding plan of care. Has not had cervical change since last check. Will increase pitocin up to 38mu/min. If still no change will then consider IUPC.  All discussed with patient  Raeford Razor CNM, FNP [facility]

## 2022-09-27 NOTE — Discharge Summary (Signed)
OB Discharge Summary     Patient Name: Laura Higgins DOB: [birth year] MRN: [id number]  Date of admission: 09/26/2022 Delivering MD: Mirna Mires, CNM  Date of Delivery: 09/29/2022  Date of discharge: 09/29/2022  Admitting diagnosis: Encounter for induction of labor [Z34.90] Intrauterine pregnancy: [redacted]w[redacted]d     Secondary diagnosis: None     Discharge diagnosis: Term Pregnancy Delivered                                                                                                Post partum procedures: NA  Augmentation: Pitocin, Cytotec, and cooks catheter  Complications: None  Hospital course:  Induction of Labor With Vaginal Delivery   22 y.o. yo G1P1001 at [redacted]w[redacted]d was admitted to the hospital 09/26/2022 for induction of labor.  Indication for induction:  elevated BMI .  Patient had an labor course without complications. Membrane Rupture Time/Date: 7:22 PM ,09/26/2022   Delivery Method:Vaginal, Spontaneous  Episiotomy: None  Lacerations:  None  Details of delivery can be found in separate delivery note.  Patient had a postpartum course complicated by nothing. Patient is discharged home 09/29/22.  Newborn Data: Birth date:09/27/2022  Birth time:4:17 PM  Gender:Female  Living status:Living  Apgars:8 ,9  Weight:3080 g   Subjective:   Physical exam  Vitals:   09/28/22 0837 09/28/22 1537 09/29/22 0025 09/29/22 0700  BP: 122/81 121/70 117/64 122/80  Pulse: 100 (!) 117 (!) 110 (!) 109  Resp: Temp: 98.5 F (36.9 C) 97.7 F (36.5 C) 97.6 F (36.4 C) 98.3 F (36.8 C)  TempSrc: Oral Oral  Oral  SpO2: 99%  98% 97%  Weight:      Height:       General: alert, cooperative, and no distress Breast: soft, non-tender, nipples without breakdown Lochia: appropriate Uterine Fundus: firm Perineum: no erythema or foul odor discharge, minimal edema DVT Evaluation: No evidence of DVT seen on physical exam. Negative Homan's sign.  Labs: Lab Results  Component  Value Date   WBC 12.0 (H) 09/29/2022   HGB 11.1 (L) 09/29/2022   HCT 33.2 (L) 09/29/2022   MCV 76.5 (L) 09/29/2022   PLT 196 09/29/2022    Discharge instruction: in After Visit Summary.  Medications:  Allergies as of 09/29/2022       Reactions   Shellfish Allergy Nausea And Vomiting, Other (See Comments)   "Funny feelings in throat"        Medication List     STOP taking these medications    aspirin 81 MG chewable tablet       TAKE these medications    ibuprofen 600 MG tablet Commonly known as: ADVIL Take 1 tablet (600 mg total) by mouth every 6 (six) hours.   multivitamin-prenatal 27-0.8 MG Tabs tablet Take 1 tablet by mouth daily at 12 noon.               Discharge Care Instructions  (From admission, onward)           Start     Ordered   09/29/22 0000  Leave  dressing on - Keep it clean, dry, and intact until clinic visit        09/29/22 1109             Activity: Advance as tolerated. Pelvic rest for 6 weeks.   Outpatient follow up:  Follow-up Information     [facility] Follow up.   Specialty: Obstetrics and Gynecology Why: call to schedule a two week telehealth visit and a six week in person postpartum visit. Please tell the office scheduler that you plan on getting an IUD placed t your 6 week postdelivery appointment Contact information: [address] [place] [id number] [phone]                  Postpartum contraception: Undecided Rhogam Given postpartum: not indicated Rubella vaccine given postpartum: to be offered before discharge Varicella vaccine given postpartum: immunity unknown, has been vaccinated TDaP given antepartum or postpartum: given 07/16/22  Newborn Data: Live born female  Birth Weight: 6 lb 12.6 oz (3080 g) APGAR: 8, 9  Newborn Delivery   Birth date/time: 09/27/2022 16:17:00 Delivery type: Vaginal, Spontaneous       Baby Feeding:  Bottle  Disposition: Baby is being held due to mother's + syphilis during pregnancy. Baby has initial positive RPR- awaiting TPA  Additional time spent discussing contraception. We reviewed OCPs Depo injections and LARCs such as IUDs and Nexplanon. She does not intend to have more children. After discussing options, she is interested in a Mirena IUD. Benefits and side effects of IUDS covered with her. Will RX her some Cytotec and Motrin for use prior to  her IUD appt.  Mirna Mires, CNM  09/29/2022 11:15 AM

## 2022-09-28 LAB — CBC
HCT: 30.7 % — ABNORMAL LOW (ref 36.0–46.0)
Hemoglobin: 10.3 g/dL — ABNORMAL LOW (ref 12.0–15.0)
MCH: 25.9 pg — ABNORMAL LOW (ref 26.0–34.0)
MCHC: 33.6 g/dL (ref 30.0–36.0)
MCV: 77.1 fL — ABNORMAL LOW (ref 80.0–100.0)
Platelets: 191 10*3/uL (ref 150–400)
RBC: 3.98 MIL/uL (ref 3.87–5.11)
RDW: 17.2 % — ABNORMAL HIGH (ref 11.5–15.5)
WBC: 20.5 10*3/uL — ABNORMAL HIGH (ref 4.0–10.5)
nRBC: 0 % (ref 0.0–0.2)

## 2022-09-28 NOTE — TOC Transition Note (Signed)
Transition of Care [facility]) - CM/SW Discharge Note   Patient Details  Name: Laura Higgins MRN: [id number] Date of Birth: 2000-11-21  [facility] CM/SW Contact:  Susa Simmonds, LCSWA Phone Number: 09/28/2022, 8:58 AM   Clinical Narrative:  Patient did not receive an urine drug screen for this admission. Patients last urine screen was in November of 2023 that was positive for marijuana. Patient denies any marijuana usage from November 2023-April 2023. Patient stated that she was living with someone who smokes marijuana and received second hand smoke. Patient states she no longer lives with that person and will be living with her boyfriend. Patient asked CSW what should she do in those situations. CSW advised patient for her not to smoke marijuana or if possible to remove herself from a situation where there are people around her smoking marijuana. Patient states that she went to a job interview and was positive for marijuana, but states its from second hand smoke. CSW explained to patient the negative effects of using drugs and especially when she is pregnant. CSW told patient that if her babies cord results come back positive she will receive a CPS case. Patient states this is her first baby. Patient voiced understanding of not using drugs.           Patient Goals and CMS Choice      Discharge Placement                         Discharge Plan and Services Additional resources added to the After Visit Summary for                                       Social Determinants of Health (SDOH) Interventions SDOH Screenings   Food Insecurity: Food Insecurity Present (09/26/2022)  Housing: Low Risk  (09/26/2022)  Transportation Needs: No Transportation Needs (09/26/2022)  Utilities: Not At Risk (09/26/2022)  Depression (PHQ2-9): Medium Risk (03/13/2022)  Financial Resource Strain: High Risk (05/13/2022)  Physical Activity: Sufficiently Active (05/13/2022)  Social  Connections: Unknown (05/13/2022)  Stress: No Stress Concern Present (05/13/2022)  Tobacco Use: Medium Risk (09/27/2022)     Readmission Risk Interventions     No data to display

## 2022-09-28 NOTE — Anesthesia Postprocedure Evaluation (Signed)
Anesthesia Post Note  Patient: Laura Higgins  Procedure(s) Performed: AN AD HOC LABOR EPIDURAL  Patient location during evaluation: Mother Baby Anesthesia Type: Epidural Level of consciousness: awake and alert Pain management: pain level controlled Vital Signs Assessment: post-procedure vital signs reviewed and stable Respiratory status: spontaneous breathing, nonlabored ventilation and respiratory function stable Cardiovascular status: stable Postop Assessment: no headache, no backache, patient able to bend at knees and able to ambulate Anesthetic complications: no   No notable events documented.   Last Vitals:  Vitals:   09/28/22 0300 09/28/22 0837  BP: 116/78 122/81  Pulse: (!) 112   Resp: 20 20  Temp: 37 C 36.9 C  SpO2: 97% 99%    Last Pain:  Vitals:   09/28/22 0921  TempSrc:   PainSc: 0-No pain                 Reed Breech

## 2022-09-28 NOTE — Progress Notes (Signed)
Obstetric Postpartum Daily Progress Note Subjective:  22 y.o. G1P1001 postpartum day #1 status post vaginal delivery.  She is ambulating, is tolerating po, is voiding spontaneously.  Her pain is well controlled on PO pain medications. Her lochia is less than menses. Bottle feeding. Mood is good.   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    Medications SCHEDULED MEDICATIONS   ibuprofen  600 mg Oral Q6H   prenatal multivitamin  1 tablet Oral Q1200   senna-docusate  2 tablet Oral Daily   Tdap  0.5 mL Intramuscular Once    MEDICATION INFUSIONS    PRN MEDICATIONS  acetaminophen, benzocaine-Menthol, coconut oil, witch hazel-glycerin **AND** dibucaine, diphenhydrAMINE, ondansetron **OR** ondansetron (ZOFRAN) IV, oxyCODONE-acetaminophen, oxyCODONE-acetaminophen, simethicone, zolpidem    Objective:   Vitals:   09/27/22 2150 09/27/22 2344 09/28/22 0300 09/28/22 0837  BP: 116/71 117/69 116/78 122/81  Pulse: (!) 112 (!) 112 (!) 112   Resp: Temp: 98.5 F (36.9 C) 98.6 F (37 C) 98.6 F (37 C) 98.5 F (36.9 C)  TempSrc: Oral Oral Oral Oral  SpO2: 97% 97% 97% 99%  Weight:      Height:        Current Vital Signs 24h Vital Sign Ranges  T 98.5 F (36.9 C) Temp  Avg: 98.6 F (37 C)  Min: 98.3 F (36.8 C)  Max: 99 F (37.2 C)  BP 122/81 BP  Min: 103/59  Max: 140/72  HR (!) 112 Pulse  Avg: 118  Min: 112  Max: 128  RR 20 Resp  Avg: 20.4  Min: 18  Max: 23  SaO2 99 % Room Air SpO2  Avg: 97.7 %  Min: 91 %  Max: 100 %       24 Hour I/O Current Shift I/O  Time Ins Outs 04/19 0701 - 04/20 0700 In: 3533.9 [P.O.:1440; I.V.:1911.9] Out: 2250 [Urine:2000] No intake/output data recorded.  General: NAD Pulmonary: no increased work of breathing Abdomen: non-distended, non-tender, fundus firm at level of umbilicus Extremities: trace edema, no erythema, no tenderness  Labs:  Recent Labs  Lab 09/26/22 0534 09/28/22 0543  WBC 10.2  20.5*  HGB 11.8* 10.3*  HCT 36.1 30.7*  PLT 235 191     Assessment:   22 y.o. G1P1001 postpartum day # 1 status post SVB  Plan:   1) Acute blood loss anemia - hemodynamically stable and asymptomatic - po ferrous sulfate  2) O POS Performed at [facility], [address] [place], [place] [address]  / Rubella <0.90 (10/04 1035)/ Varicella Unknown  3) TDAP status 07/16/2022  4) bottle /Contraception =  undecided   5) Disposition discharge 4/21   Ellouise Newer [facility], CNM  09/28/2022 10:20 AM

## 2022-09-28 NOTE — Lactation Note (Signed)
This note was copied from a baby's chart. Lactation Consultation Note  Patient Name: Laura Higgins [id number] Date: 09/28/2022 Age:24 hours  Reason for lactation follow-up: to confirm mom's feeding choice and inquire if mom wanted to initiate pumping.  On follow-up mom reports she does not want to breastfeed or initiate breastpumping. Per mom her plan is to formula feed her baby.  Update provided to care nurse.  Ricciardelli Song 09/28/2022, 1:08 PM

## 2022-09-29 ENCOUNTER — Other Ambulatory Visit: Payer: Self-pay | Admitting: Obstetrics

## 2022-09-29 DIAGNOSIS — Z30430 Encounter for insertion of intrauterine contraceptive device: Secondary | ICD-10-CM

## 2022-09-29 LAB — CBC
HCT: 33.2 % — ABNORMAL LOW (ref 36.0–46.0)
Hemoglobin: 11.1 g/dL — ABNORMAL LOW (ref 12.0–15.0)
MCH: 25.6 pg — ABNORMAL LOW (ref 26.0–34.0)
MCHC: 33.4 g/dL (ref 30.0–36.0)
MCV: 76.5 fL — ABNORMAL LOW (ref 80.0–100.0)
Platelets: 196 10*3/uL (ref 150–400)
RBC: 4.34 MIL/uL (ref 3.87–5.11)
RDW: 17.2 % — ABNORMAL HIGH (ref 11.5–15.5)
WBC: 12 10*3/uL — ABNORMAL HIGH (ref 4.0–10.5)
nRBC: 0 % (ref 0.0–0.2)

## 2022-09-29 MED ORDER — MISOPROSTOL 200 MCG PO TABS
200.0000 ug | ORAL_TABLET | Freq: Once | ORAL | 0 refills | Status: DC
Start: 2022-09-29 — End: 2022-09-29

## 2022-09-29 MED ORDER — IBUPROFEN 600 MG PO TABS: 600.0000 mg | ORAL_TABLET | Freq: Four times a day (QID) | ORAL | 0 refills | Status: DC

## 2022-09-29 MED ORDER — MEASLES, MUMPS & RUBELLA VAC IJ SOLR
0.5000 mL | Freq: Once | INTRAMUSCULAR | Status: AC
  Administered 2022-09-29: 0.5 mL via SUBCUTANEOUS
  Filled 2022-09-29: qty 0.5

## 2022-09-29 NOTE — Progress Notes (Signed)
Verb understanding of d/c instructions/to stay in room to take care of infant

## 2022-09-30 ENCOUNTER — Encounter: Payer: Self-pay | Admitting: Obstetrics and Gynecology

## 2022-09-30 LAB — T.PALLIDUM AB, TOTAL: T Pallidum Abs: REACTIVE — AB

## 2022-10-10 ENCOUNTER — Telehealth: Payer: Self-pay | Admitting: Licensed Clinical Social Worker

## 2022-10-10 ENCOUNTER — Ambulatory Visit: Payer: Medicaid Other | Admitting: Licensed Clinical Social Worker

## 2022-10-10 ENCOUNTER — Encounter: Payer: Self-pay | Admitting: Obstetrics

## 2022-10-10 ENCOUNTER — Ambulatory Visit (INDEPENDENT_AMBULATORY_CARE_PROVIDER_SITE_OTHER): Payer: BC Managed Care – PPO | Admitting: Obstetrics

## 2022-10-10 NOTE — Telephone Encounter (Signed)
Patient did not keep appointment. LCSW attempted to call patient and vm is not set up.

## 2022-10-10 NOTE — Progress Notes (Signed)
    Post Partum Visit Note  Laura Higgins is a 22 y.o. G59P1001 female who presents for a postpartum visit. She is 2 weeks postpartum following a normal spontaneous vaginal delivery.  The delivery was at 39+3 gestational weeks.  Anesthesia: epidural. Postpartum course has been uncomplicated. Baby boy, Nahmir, is doing well. Baby is feeding by bottle. Bleeding is light & pink. Bowel function is normal. Bladder function is normal. Patient is not sexually active. Contraception method is  plans Mirena IUD at 6w PP . Postpartum depression screening: negative.     EPDS: 4   Health Maintenance Due  Topic Date Due   COVID-19 Vaccine (1) Never done     Review of Systems Constitutional: negative Respiratory: negative Cardiovascular: negative Gastrointestinal: negative Genitourinary:positive for pink vaginal discharge  Objective:  BP 115/75   Pulse (!) 108   Ht 5\' 1"  (1.549 m)   Wt 212 lb 8 oz (96.4 kg)   LMP 12/25/2021 (Exact Date)   Breastfeeding No   BMI 40.15 kg/m    General:  alert, cooperative, and no distress  Lungs: Normal work of breathing   Heart:  Normal rate  Abdomen: Soft, nontender, fundus below SP    GU exam:  not indicated       Assessment:    Postpartum care following vaginal delivery   Normal postpartum exam.   Plan:   Essential components of care per ACOG recommendations:  1.  Mood and well being: Patient with negative depression screening today. Reviewed local resources for support.    2. Infant care and feeding:  -Patient currently breastmilk feeding? No.  -Social determinants of health (SDOH) reviewed in EPIC. No concerns  3. Sexuality, contraception and birth spacing - Patient does not want a pregnancy in the next year.  Plans Mirena IUD at 6w PP. Abstinence recommended until after placement.  4. Sleep and fatigue -Encouraged family/partner/community support of 4 hrs of uninterrupted sleep to help with mood and fatigue  5. Physical  Recovery - Patient had a Vaginal, no problems at delivery.  - Patient has urinary incontinence? No.   Dominica Severin, CNM [facility] at [place], [facility]

## 2022-10-22 ENCOUNTER — Other Ambulatory Visit: Payer: Medicaid Other

## 2022-10-23 ENCOUNTER — Telehealth: Payer: Self-pay

## 2022-11-07 ENCOUNTER — Telehealth: Payer: Self-pay | Admitting: Obstetrics

## 2022-11-07 ENCOUNTER — Ambulatory Visit: Payer: BC Managed Care – PPO | Admitting: Obstetrics

## 2022-11-07 NOTE — Telephone Encounter (Signed)
Reached out to pt to reschedule 6 week PP visit that was scheduled with Laura Higgins on 11/07/2022 at 1:55.  Could not leave message, mailbox was not set up.

## 2022-11-08 ENCOUNTER — Encounter: Payer: Self-pay | Admitting: Obstetrics

## 2022-11-08 NOTE — Telephone Encounter (Signed)
Reached out to pt (2x) to reschedule 6 week PP visit that was scheduled with Missy on 11/07/2022 at 1:55.  Could not leave message, mailbox was not set up.  Will send a MyChart letter.

## 2022-11-11 ENCOUNTER — Telehealth: Payer: Self-pay | Admitting: Obstetrics

## 2022-11-11 NOTE — Telephone Encounter (Signed)
Patient called today to rescheduled her six week postpartum office visit(11/25/2022 at 1:55 PM).  She stated that she was told that she could come by the office to pick up a note so that he could return to work.  Please Advise.

## 2022-11-14 NOTE — Telephone Encounter (Signed)
Pt was made aware no other concern .

## 2022-11-25 ENCOUNTER — Encounter: Payer: Self-pay | Admitting: Obstetrics

## 2022-11-25 ENCOUNTER — Ambulatory Visit (INDEPENDENT_AMBULATORY_CARE_PROVIDER_SITE_OTHER): Payer: BC Managed Care – PPO | Admitting: Obstetrics

## 2022-11-25 VITALS — Ht 61.0 in | Wt 208.0 lb

## 2022-11-25 DIAGNOSIS — Z3042 Encounter for surveillance of injectable contraceptive: Secondary | ICD-10-CM

## 2022-11-25 DIAGNOSIS — Z3202 Encounter for pregnancy test, result negative: Secondary | ICD-10-CM | POA: Diagnosis not present

## 2022-11-25 LAB — POCT URINE PREGNANCY: Preg Test, Ur: NEGATIVE

## 2022-11-25 MED ORDER — LORAZEPAM 1 MG PO TABS: 1.0000 mg | ORAL_TABLET | Freq: Once | ORAL | 0 refills | Status: DC | PRN

## 2022-11-25 MED ORDER — MEDROXYPROGESTERONE ACETATE 150 MG/ML IM SUSY
150.0000 mg | PREFILLED_SYRINGE | Freq: Once | INTRAMUSCULAR | Status: AC
  Administered 2022-11-25: 150 mg via INTRAMUSCULAR

## 2022-11-25 MED ORDER — SERTRALINE HCL 25 MG PO TABS
25.0000 mg | ORAL_TABLET | Freq: Every day | ORAL | 0 refills | Status: DC
Start: 2022-11-25 — End: 2023-08-08

## 2022-11-25 NOTE — Progress Notes (Signed)
    Post Partum Visit Note  Laura Higgins is a 22 y.o. G28P1001 female who presents for a postpartum visit. She is 8 weeks postpartum following a normal spontaneous vaginal delivery.  I have fully reviewed the prenatal and intrapartum course. The delivery was at 39+3 gestational weeks.  Anesthesia: epidural. Postpartum course has been uncomplicated. Baby is doing well. Baby is feeding by  bottle . Bleeding:  spotting . Bowel function is normal. Bladder function is normal. Patient is sexually active. Postpartum depression screening: positive. Cally reports that she is concered about her anxiety and irritability and is interested in counseling and medication. She denies HI/SI. She initially wanted an IUD today but decided that she would like Depo today and will come back for an IUD in 12 weeks.     Health Maintenance Due  Topic Date Due   COVID-19 Vaccine (1) Never done    The following portions of the patient's history were reviewed and updated as appropriate: allergies, current medications, past medical history, past social history, past surgical history, and problem list.  Review of Systems Pertinent items noted in HPI and remainder of comprehensive ROS otherwise negative.  Objective:  Ht 5\' 1"  (1.549 m)   Wt 208 lb (94.3 kg)   LMP  (LMP Unknown)   Breastfeeding No   BMI 39.30 kg/m    General:  alert, cooperative, and appears stated age   Breasts:  normal  Lungs: clear to auscultation bilaterally  Heart:  regular rate and rhythm, S1, S2 normal, no murmur, click, rub or gallop  Abdomen: soft, non-tender; bowel sounds normal; no masses,  no organomegaly   Wound : N/A  GU exam:  normal            Assessment:    8 week postpartum exam.   Plan:   Essential components of care per ACOG recommendations:  1.  Mood and well being: Patient with positive depression screening today. Reviewed local resources for support. Contacted Gayland Curry to connect her Will start  Zoloft. Reviewed side effects, proper administration, and danger signs,  - Patient tobacco use? No.  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 Infant care and feeding:  -Patient currently breastmilk feeding? No.  -Social determinants of health (SDOH) reviewed in EPIC. The following needs were identified: mental health counseling  3. Sexuality, contraception and birth spacing - Patient does not want a pregnancy in the next year.  Desired family size is 1 child.  - Reviewed reproductive life planning. Reviewed contraceptive methods based on pt preferences and effectiveness.  Patient desired Hormonal Injection today.    4. Sleep and fatigue -Encouraged family/partner/community support of 4 hrs of uninterrupted sleep to help with mood and fatigue  5. Physical Recovery  - Discussed patients delivery and complications. She describes her labor and postpartum period as hard and bad. - Patient had a  vaginal birth . Patient had an intact perineum. Perineal healing reviewed. Patient expressed understanding. - Patient has urinary incontinence? No. - Patient is safe to resume physical and sexual activity  6.  Health Maintenance - HM due items addressed Yes - Last pap smear 03/13/22, NILM. Pap smear not done at today's visit.  -Breast Cancer screening indicated? No.   7. Chronic Disease/Pregnancy Condition follow up: None  - F/u in about 12 weeks for IUD, mood check, and STI screening.Rx for pre-procedure lorazepam sent,  Glenetta Borg, CNM [facility] at [place], [facility]

## 2022-11-26 ENCOUNTER — Telehealth: Payer: Self-pay | Admitting: Licensed Clinical Social Worker

## 2022-11-26 NOTE — Telephone Encounter (Signed)
-----   Message from Crista Luria sent at 11/26/2022 10:09 AM EDT ----- Regarding: RE: Counseling for PP pt Yes I will do that today .  Marchelle Folks -   Pls accept this as a referral for your program on K client Laura Higgins per [name] request .    Much thanks   Gayland Curry  ----- Message ----- From: Glenetta Borg, CNM Sent: 11/25/2022   5:32 PM EDT To: Crista Luria Subject: Counseling for PP pt                           Hi Angie,  Lemon is interested in counseling. Would you be able to connect her with Marchelle Folks?  Thank you!  Missy

## 2023-03-04 DIAGNOSIS — F411 Generalized anxiety disorder: Secondary | ICD-10-CM | POA: Diagnosis not present

## 2023-05-29 ENCOUNTER — Other Ambulatory Visit: Payer: Self-pay

## 2023-05-29 ENCOUNTER — Emergency Department
Admission: EM | Admit: 2023-05-29 | Discharge: 2023-05-29 | Disposition: A | Discharge status: Home / Self Care | Payer: Medicaid Other | Attending: Emergency Medicine | Admitting: Emergency Medicine

## 2023-05-29 DIAGNOSIS — K0889 Other specified disorders of teeth and supporting structures: Secondary | ICD-10-CM | POA: Diagnosis not present

## 2023-05-29 DIAGNOSIS — E109 Type 1 diabetes mellitus without complications: Secondary | ICD-10-CM | POA: Insufficient documentation

## 2023-05-29 DIAGNOSIS — K029 Dental caries, unspecified: Secondary | ICD-10-CM | POA: Insufficient documentation

## 2023-05-29 MED ORDER — AMOXICILLIN-POT CLAVULANATE 875-125 MG PO TABS
1.0000 | ORAL_TABLET | Freq: Once | ORAL | Status: AC
  Administered 2023-05-29: 1 via ORAL
  Filled 2023-05-29: qty 1

## 2023-05-29 MED ORDER — IBUPROFEN 600 MG PO TABS
600.0000 mg | ORAL_TABLET | Freq: Once | ORAL | Status: AC
  Administered 2023-05-29: 600 mg via ORAL
  Filled 2023-05-29: qty 1

## 2023-05-29 MED ORDER — OXYCODONE HCL 5 MG PO TABS: 5.0000 mg | ORAL_TABLET | Freq: Three times a day (TID) | ORAL | 0 refills | Status: DC | PRN

## 2023-05-29 MED ORDER — ACETAMINOPHEN 500 MG PO TABS
1000.0000 mg | ORAL_TABLET | Freq: Once | ORAL | Status: AC
  Administered 2023-05-29: 1000 mg via ORAL
  Filled 2023-05-29: qty 2

## 2023-05-29 MED ORDER — AMOXICILLIN-POT CLAVULANATE 875-125 MG PO TABS: 1.0000 | ORAL_TABLET | Freq: Two times a day (BID) | ORAL | 0 refills | Status: AC

## 2023-05-29 NOTE — ED Provider Notes (Signed)
[facility] Provider Note    Event Date/Time   First MD Initiated Contact with Patient 05/29/23 [phone]     (approximate)   History   Dental Pain   HPI  Laura Higgins is a 22 y.o. female   Past medical history of DM1 who presents emergency department with dental pain.  She has had a fractured tooth and dental caries of the left upper molar for quite some time and the pain is worsened over the last few days.  She denies any purulence.  No fevers or chills.  Maintaining airway and secretions and swallowing okay.  She has no other acute medical complaints.      Physical Exam   Triage Vital Signs: ED Triage Vitals  Encounter Vitals Group     BP 05/29/23 0344 123/86     Systolic BP Percentile --      Diastolic BP Percentile --      Pulse Rate 05/29/23 0344 88     Resp 05/29/23 0344 20     Temp 05/29/23 0344 97.9 F (36.6 C)     Temp src --      SpO2 05/29/23 0344 93 %     Weight 05/29/23 0343 190 lb (86.2 kg)     Height 05/29/23 0343 5\' 1"  (1.549 m)     Head Circumference --      Peak Flow --      Pain Score 05/29/23 0343 9     Pain Loc --      Pain Education --      Exclude from Growth Chart --     Most recent vital signs: Vitals:   05/29/23 0344  BP: 123/86  Pulse: 88  Resp: 20  Temp: 97.9 F (36.6 C)  SpO2: 93%    General: Awake, no distress.  CV:  Good peripheral perfusion.  Resp:  Normal effort.  Abd:  No distention.  Other:  Dental caries left upper molar, no obvious abscess intraorally, posterior oropharynx appears normal and intact no submandibular swelling or cervical lymphadenopathy neck is supple with full range of motion she has normal hemodynamics and no fever.   ED Results / Procedures / Treatments   Labs (all labs ordered are listed, but only abnormal results are displayed) Labs Reviewed - No data to display    PROCEDURES:  Critical Care performed: No  Procedures   MEDICATIONS ORDERED IN  ED: Medications  acetaminophen (TYLENOL) tablet 1,000 mg (has no administration in time range)  ibuprofen (ADVIL) tablet 600 mg (has no administration in time range)  amoxicillin-clavulanate (AUGMENTIN) 875-125 MG per tablet 1 tablet (has no administration in time range)    IMPRESSION / MDM / ASSESSMENT AND PLAN / ED COURSE  I reviewed the triage vital signs and the nursing notes.                                Patient's presentation is most consistent with acute presentation with potential threat to life or bodily function.  Differential diagnosis includes, but is not limited to, dental caries, dental infection, considered but less likely abscess, Ludwig, airway compromise   The patient is on the cardiac monitor to evaluate for evidence of arrhythmia and/or significant heart rate changes.  MDM:    Dental caries and likely dental infection with increasing pain to the left upper molar.  Will give prophylactic antibiotics, pain medicines and referral to dentist.  No obvious abscess, airway intact, no evidence of Ludwig.  Discharge.         FINAL CLINICAL IMPRESSION(S) / ED DIAGNOSES   Final diagnoses:  Pain, dental  Dental caries     Rx / DC Orders   ED Discharge Orders          Ordered    amoxicillin-clavulanate (AUGMENTIN) 875-125 MG tablet  2 times daily        05/29/23 0453    oxyCODONE (ROXICODONE) 5 MG immediate release tablet  Every 8 hours PRN        05/29/23 0453             Note:  This document was prepared using Dragon voice recognition software and may include unintentional dictation errors.    Pilar Jarvis, MD 05/29/23 [phone]

## 2023-05-29 NOTE — Discharge Instructions (Addendum)
Take acetaminophen 650 mg and ibuprofen 400 mg every 6 hours for pain.  Take with food. Take oxycodone for severe pain only.   Take antibiotics.   Call dentist for an appointment as soon as possible.   Thank you for choosing [place] for your health care today!  Please see your primary doctor this week for a follow up appointment.   If you have any new, worsening, or unexpected symptoms call your doctor right away or come back to the emergency department for reevaluation.  It was my pleasure to care for you today.   Daneil Dan Modesto Charon, MD

## 2023-05-29 NOTE — ED Triage Notes (Signed)
Pt reports left side dental pain xfew days. No swelling

## 2023-06-11 NOTE — L&D Delivery Note (Signed)
 Delivery Note   [name] is a 23 y.o. G2P1001 at [redacted]w[redacted]d Estimated Date of Delivery: 03/23/24  PRE-OPERATIVE DIAGNOSIS:  1) [redacted]w[redacted]d pregnancy.  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) 3) History maternal syphilis with reinfection and re-treatment- Titers 1:8-> 1:31-> 1:4 4) Rubella NI 5) Obesity  POST-OPERATIVE DIAGNOSIS:  1) [redacted]w[redacted]d pregnancy s/p Vaginal, Spontaneous As above  Delivery Type: Vaginal, Spontaneous   Delivery Anesthesia: Epidural  Labor Complications:      ESTIMATED BLOOD LOSS: 100 ml    FINDINGS:   Information for the patient's newborn:  [name], [name] [[id number]]  Live born female  Birth Weight:   APGAR: 8, 9  Newborn Delivery   Birth date/time: 03/19/2024 21:10:00 Delivery type: Vaginal, Spontaneous      SPECIMENS:   PLACENTA:   Appearance: Intact   Removal: Spontaneous     Disposition: discarded  Cord Blood: collected for typing, mother of baby O POS  DISPOSITION:  Infant left in stable condition in the delivery room, with L&D personnel and mother,  NARRATIVE SUMMARY: Labor course:  Laura Higgins is a G2P1001 at [redacted]w[redacted]d who presented to Labor & Delivery for contractions and SROM. Her initial cervical exam was 2.5/ 50/-3. Labor proceeded with augmentation and she was found to be completely dilated at 2056. With excellent maternal pushing effort, she birthed a viable female infant at 2110. Head birthed ROA, restituted to ROT. There was a nuchal cord x 1. The shoulders were birthed without difficulty. The infant was placed skin-to-skin with patient. The cord was doubly clamped and cut when pulsations ceased. Pitocin  was bolused in IV fluid for third stage management. The placenta delivered spontaneously and was noted to be intact with a 3VC. A perineal and vaginal examination was performed. Episiotomy/Lacerations: None Tolerated well. Mother and baby left in stable condition.  [name], CNM 03/19/2024 9:46 PM

## 2023-07-25 ENCOUNTER — Ambulatory Visit: Payer: Medicaid Other

## 2023-07-25 NOTE — Progress Notes (Deleted)
    NURSE VISIT NOTE  Subjective:    Patient ID: Laura Higgins, female    DOB: 2000/06/21, 23 y.o.   MRN: [id number]  HPI  Patient is a 23 y.o. G60P1001 female who presents for evaluation of amenorrhea. She believes she could be pregnant. {pregnancy desire:14500::"Pregnancy is desired."} Sexual Activity: {sexual partners:705}. Current symptoms also include: {preg sx:18128}. Last period was {norm/abn:16337}.    Objective:    There were no vitals taken for this visit.  Lab Review  No results found for any visits on 07/25/23.  Assessment:   No diagnosis found.  Plan:   {AOB + PREGNANCY PLAN:28596:p}  {AOB - PREGNANCY PLAN:28597:p}   Fonda Kinder, CMA

## 2023-07-28 ENCOUNTER — Encounter: Payer: Self-pay | Admitting: Obstetrics

## 2023-07-28 ENCOUNTER — Ambulatory Visit (INDEPENDENT_AMBULATORY_CARE_PROVIDER_SITE_OTHER): Payer: Medicaid Other

## 2023-07-28 VITALS — BP 137/83 | HR 94 | Wt 213.3 lb

## 2023-07-28 DIAGNOSIS — Z3200 Encounter for pregnancy test, result unknown: Secondary | ICD-10-CM

## 2023-07-28 DIAGNOSIS — N912 Amenorrhea, unspecified: Secondary | ICD-10-CM

## 2023-07-28 DIAGNOSIS — Z3201 Encounter for pregnancy test, result positive: Secondary | ICD-10-CM | POA: Diagnosis not present

## 2023-07-28 LAB — POCT URINE PREGNANCY: Preg Test, Ur: POSITIVE — AB

## 2023-07-28 NOTE — Progress Notes (Signed)
    NURSE VISIT NOTE  Subjective:    Patient ID: Laura Higgins, female    DOB: 2000-09-04, 23 y.o.   MRN: [id number]  HPI  Patient is a 23 y.o. G75P1001 female who presents for evaluation of amenorrhea. She believes she could be pregnant. Pregnancy is not desired. Sexual Activity: single partner, contraception: none. Current symptoms also include: frequent urination,home positive test. Last period was normal.    Objective:    BP 137/83 (BP Location: Left Arm, Patient Position: Sitting, Cuff Size: Large)   Pulse 94   Wt 213 lb 4.8 oz (96.8 kg)   LMP 06/17/2023 (Approximate)   BMI 40.30 kg/m   Lab Review  Results for orders placed or performed in visit on 07/28/23  POCT urine pregnancy  Result Value Ref Range   Preg Test, Ur Positive (A) Negative    Assessment:   1. Possible pregnancy     Plan:   Pregnancy Test: Positive  BP Cuff Measurement taken. Cuff Size Adult Large EDD: 03/23/2024 GA: [redacted]w[redacted]d    Burtis Junes, CMA

## 2023-08-05 ENCOUNTER — Ambulatory Visit: Payer: Medicaid Other

## 2023-08-07 NOTE — Progress Notes (Signed)
 New OB Intake  I connected with  Garret Reddish on 08/07/23 at  9:15 AM EST by Video Visit and verified that I am speaking with the correct person using two identifiers. Nurse is located at [facility] and pt is located at home.  I explained I am completing New OB Intake today. We discussed her EDD of 03/23/24 that is based on LMP of 06/17/23. Pt is G2/P1. I reviewed her allergies, medications, Medical/Surgical/OB history, and appropriate screenings. There are no cats in the home. Based on history, this is a/an pregnancy uncomplicated . Her obstetrical history is significant for  N/A .  Patient Active Problem List   Diagnosis Date Noted   Postpartum care following cesarean delivery 09/29/2022   Positive urine drug screen  04/16/22 +MJ 04/22/2022   Syphilis affecting pregnancy 03/13/22 RPR 1:64 03/18/2022   Rubella non-immune status, antepartum 03/18/2022   UTI (urinary tract infection) during pregnancy 03/13/22 E. 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  Anatomy US Explained first scheduled [place] will be on 08/27/23 and Anatomy US will be done at 20 weeks of gestational age.  Labs Discussed genetic screening with patient. Patient desires genetic testing to be drawn at new OB visit. Discussed possible labs to be drawn at new OB appointment.  COVID Vaccine Patient has not had COVID vaccine.   Social Determinants of Health Food Insecurity: denies food insecurity Transportation: Patient denies transportation needs. Childcare: Discussed no children allowed at  ultrasound appointments.   First visit review I reviewed new OB appt with pt. I explained she will have blood work and pap smear/pelvic exam if indicated. Explained pt will be seen by Tresea Mall, CNM at first visit; encounter routed to appropriate provider.   Donnetta Hail, [facility] 08/07/2023  10:56 AM

## 2023-08-08 ENCOUNTER — Telehealth: Payer: Medicaid Other

## 2023-08-08 DIAGNOSIS — Z3A01 Less than 8 weeks gestation of pregnancy: Secondary | ICD-10-CM

## 2023-08-08 DIAGNOSIS — Z3481 Encounter for supervision of other normal pregnancy, first trimester: Secondary | ICD-10-CM

## 2023-08-08 DIAGNOSIS — Z3480 Encounter for supervision of other normal pregnancy, unspecified trimester: Secondary | ICD-10-CM | POA: Insufficient documentation

## 2023-08-08 DIAGNOSIS — Z3687 Encounter for antenatal screening for uncertain dates: Secondary | ICD-10-CM

## 2023-08-26 ENCOUNTER — Other Ambulatory Visit: Payer: Self-pay | Admitting: Advanced Practice Midwife

## 2023-08-26 DIAGNOSIS — O3680X0 Pregnancy with inconclusive fetal viability, not applicable or unspecified: Secondary | ICD-10-CM

## 2023-08-26 DIAGNOSIS — Z3687 Encounter for antenatal screening for uncertain dates: Secondary | ICD-10-CM

## 2023-08-26 DIAGNOSIS — Z3480 Encounter for supervision of other normal pregnancy, unspecified trimester: Secondary | ICD-10-CM

## 2023-08-27 ENCOUNTER — Ambulatory Visit (INDEPENDENT_AMBULATORY_CARE_PROVIDER_SITE_OTHER): Payer: Medicaid Other

## 2023-08-27 DIAGNOSIS — O3680X0 Pregnancy with inconclusive fetal viability, not applicable or unspecified: Secondary | ICD-10-CM

## 2023-08-27 DIAGNOSIS — Z3A10 10 weeks gestation of pregnancy: Secondary | ICD-10-CM | POA: Diagnosis not present

## 2023-09-10 ENCOUNTER — Encounter: Payer: Self-pay | Admitting: Advanced Practice Midwife

## 2023-09-10 ENCOUNTER — Ambulatory Visit (INDEPENDENT_AMBULATORY_CARE_PROVIDER_SITE_OTHER): Payer: Medicaid Other | Admitting: Advanced Practice Midwife

## 2023-09-10 ENCOUNTER — Other Ambulatory Visit (HOSPITAL_COMMUNITY)
Admission: RE | Admit: 2023-09-10 | Discharge: 2023-09-10 | Disposition: A | Discharge status: Home / Self Care | Source: Ambulatory Visit | Attending: Advanced Practice Midwife | Admitting: Advanced Practice Midwife

## 2023-09-10 VITALS — BP 109/70 | HR 93 | Wt 214.0 lb

## 2023-09-10 DIAGNOSIS — Z3491 Encounter for supervision of normal pregnancy, unspecified, first trimester: Secondary | ICD-10-CM | POA: Diagnosis not present

## 2023-09-10 DIAGNOSIS — Z113 Encounter for screening for infections with a predominantly sexual mode of transmission: Secondary | ICD-10-CM | POA: Diagnosis not present

## 2023-09-10 DIAGNOSIS — Z3A12 12 weeks gestation of pregnancy: Secondary | ICD-10-CM | POA: Diagnosis not present

## 2023-09-10 DIAGNOSIS — Z3480 Encounter for supervision of other normal pregnancy, unspecified trimester: Secondary | ICD-10-CM | POA: Diagnosis not present

## 2023-09-10 DIAGNOSIS — Z1379 Encounter for other screening for genetic and chromosomal anomalies: Secondary | ICD-10-CM | POA: Diagnosis not present

## 2023-09-10 NOTE — Patient Instructions (Signed)
 Prenatal Care Prenatal care is health care you get when pregnant. It helps you and your unborn baby stay as healthy as possible. Start prenatal care early in your pregnancy and continue to go to visits during your pregnancy. Prenatal care may be given by a midwife, a family practice doctor, a Publishing rights manager, physician assistant, or a childbirth and pregnancy doctor. What are the benefits of prenatal care? In prenatal care, your health care provider will get to know your medical history. You'll be checked for conditions that might affect you and your baby. Prenatal care will: Lower the risk for problems as your child grows. Lower certain risks for your baby, especially the risk that: Your child may be born early. Your child will have a low weight at birth. What can I expect at the first prenatal care visit? Your first visit will likely be the longest. You should ask to be seen as soon as you know you're pregnant. The first visit is a good time to talk about any questions or concerns. Make a list of questions to ask your provider at your visits. Medical history At your visit, you and your provider will talk about your medical history, including: Your family's medical history and the medical history of the baby's father. Any past pregnancies and long-term (chronic) health conditions. Any surgeries or procedures you have had. All medicines you're taking. Tell them if you're taking herbs or supplements too. Any tobacco, alcohol, or drug use. Other problems that may harm you and your baby. Tell them if: You need food or housing. You have been around chemicals or radiation. Your partner yells at you, hits you, or hurts you. Tests and screenings Your provider will: Do a physical exam, including a pelvic and breast exam. Do tests to check for: Urinary tract infection (UTI). Sexually transmitted infections (STIs). Low iron levels in your blood. This is called anemia. Blood type and certain  proteins on red blood cells called Rh antibodies. Infections and immunity to viruses, such as hepatitis B and rubella. HIV. Ask your provider if you need to be checked for genetic diseases. Tips about staying healthy Your provider will also give you information about how to keep yourself and your baby healthy, including: Nutrition, vitamins, and food safety. Physical activity. How to treat some problems, such as morning sickness. How to avoid infections and substances that may harm your baby. Caring for your teeth. Work and travel. Problems that require you to call your provider. How often will I have prenatal care visits? After your first prenatal care visit, you will have regular visits throughout your pregnancy. You may visit your provider as follows: Up to week 28 of pregnancy: once every 4 weeks. 28-36 weeks: once every 2 weeks. After 36 weeks: every week until delivery. Some people may have more visits. Others may have fewer. It all depends on your health and that of your baby. Keep all prenatal visits. This is one way for you and your baby to stay as healthy as possible. What happens during routine prenatal care visits? Your provider will: Check your weight and blood pressure. Check your baby's heart sounds. Ask questions about your diet, exercise, sleeping patterns, and whether you can feel the baby move. Ask about any pregnancy symptoms you're having and how you're dealing with them. Tell your provider if: You throw up or feel like you may throw up. You have discharge or you bleed from your vagina. You have trouble pooping (constipation). You have swelling, headaches, or trouble  seeing. You are very tired, or you feel sad and anxious all the time. You have discomfort, including back pain or pain in the pelvis. Tell you problems to watch for during your pregnancy, including signs of labor. Measure the height of your uterus in your belly. This is called fundal height. What  tests might I have during prenatal care visits? You may have blood, urine, and imaging tests. These may include: Urine tests to check for blood sugar, protein, or signs of infection. Genetic testing. Ultrasounds to check your baby's growth, development, and well-being. Your baby may also be checked for congenital conditions. Glucose tests to check for gestational diabetes. This is a form of diabetes that a person can get when pregnant. A test to check for group B strep (GBS) infection. What else can I expect during prenatal care visits? Your provider may give you some vaccines. Getting certain vaccines during pregnancy can protect your baby after birth. These may include: A flu shot. Tdap (tetanus, diphtheria, pertussis) vaccine. A COVID-19 vaccine. A RSV vaccine. Later in your pregnancy, your provider may talk to you about: Childbirth and childbirth classes. Breastfeeding and breastfeeding classes. Birth control after your baby is born. Where to find more information Office on Women's Health: TravelLesson.ca American Pregnancy Association: americanpregnancy.org March of Dimes: marchofdimes.org This information is not intended to replace advice given to you by your health care provider. Make sure you discuss any questions you have with your health care provider. Document Revised: 09/30/2022 Document Reviewed: 09/30/2022 Elsevier Patient Education  2024 [employer]Exercise During Pregnancy Exercise is an important part of being healthy for people of all ages. Exercise helps your heart and lungs work well. Exercise also: Helps you stay strong and flexible. Helps you keep a healthy body weight. Boosts your energy levels and improves your mood. You should try to exercise regularly during pregnancy. Exercise routines may need to change later in your pregnancy. In rare cases, certain medical problems in your pregnancy may limit the exercise you can do during pregnancy. Your health care  provider will give you information on what exercises will work for you. How does exercise help during pregnancy? Along with staying strong and flexible, exercising during pregnancy can help: Keep strength in muscles that are used during labor and birth. Control weight gain. Speed up your recovery after giving birth. Reduce the need for insulin if you get diabetes during pregnancy. Decrease low back pain. Lower the risk for depression. Lower the risk of cesarean delivery. Treat trouble pooping (constipation). How does exercise affect my baby? Exercise can help you have a healthy pregnancy. Exercise does not cause your baby to be born early. It will not cause your baby to weigh less at birth. What exercises can I do? Many exercises are safe for you to do during pregnancy. Do a variety of exercises that safely increase your heart and breathing rates and help you build and maintain muscle strength. Do exercises as told by your provider. Your provider may recommend: Walking. Swimming. Water aerobics. Riding a stationary bike. Modified yoga or Pilates. Tell your instructor that you're pregnant. Avoid overstretching. Avoid lying on your back for long periods of time. Resistance exercises with weights or elastic bands. Running or jogging. Choose this type of exercise only if: You ran or jogged regularly before your pregnancy. You can run or jog and still talk in full sentences. What exercises should I avoid? You may be told to limit high-intensity exercise depending on your level of fitness and if  you exercised regularly before you became pregnant. You can tell that you're exercising at a high intensity if you're breathing much harder and faster and can't hold a conversation while exercising. You may be told to: Avoid jogging or running, unless you jogged or ran regularly before you became pregnant. Do not run or jog so fast that you're unable to have a conversation. Avoid activities that put you  at risk for falling on your belly or getting hit in the belly. Some of these are: Downhill skiing. Rock climbing. Cycling and gymnastics. Horseback riding. Surfing and waterskiing. Contact sports. Avoid scuba diving. Avoid skydiving. Avoid activities that take place in a room that's heated to high temperatures, such as hot yoga or hot Pilates. How do I exercise in a safe way?  Start slowly. Ask your provider to recommend the types of exercise that are safe for you. Avoid overheating. Do not exercise in very high temperatures or hot rooms. Avoid hot yoga or hot Pilates. Avoid standing still or lying flat on your back as much as you can. Avoid losing too much fluid (dehydration). Drink more fluids as told. Drink before, during, and after you exercise. Avoid overstretching. Because of hormone changes during pregnancy, it's easy to overstretch muscles, tendons, and ligaments. Ligaments are the tissues that connect bones to each other. Do not exercise to lose weight. Do not exercise at more than 6,000 feet above sea level (high elevation) if you don't live at that elevation. Tips and recommendations Wear loose-fitting, breathable clothes. Wear a sports bra to support your breasts. Exercise on most days or all days of the week. Try to exercise for 30 minutes a day, 5 days a week. If problems come up during your pregnancy, you provider may tell you to limit some exercises or to exercise less. If you have concerns, ask your provider. If you actively exercised before your pregnancy, your provider may tell you to continue to do moderate-intensity to high-intensity exercise. If you're just starting to exercise or didn't exercise much before your pregnancy, your provider may tell you to do low-intensity to moderate-intensity exercise. Questions to ask your health care provider Is exercise safe for me? What are signs that I should stop exercising? Does my health condition mean that I should not  exercise during pregnancy? When should I avoid exercising during pregnancy? Stop exercising and contact a health care provider if: You have any unusual symptoms, such as: Mild contractions or cramps in the belly. Dizziness that does not go away when you rest. Headache. Pain and swelling of your calves. Bleeding or fluid leaking from your vagina. Stop exercising and get help right away if: You have: Chest pain. Shortness of breath. Sudden, severe pain in your low back or your belly. Regular, painful contractions before 37 weeks of pregnancy. These symptoms may be an emergency. Call 911 right away. Do not wait to see if the symptoms will go away. Do not drive yourself to the hospital. This information is not intended to replace advice given to you by your health care provider. Make sure you discuss any questions you have with your health care provider. Document Revised: 01/20/2023 Document Reviewed: 01/20/2023 Elsevier Patient Education  2024 [employer]Eating Plan for Pregnant Women While you are pregnant, your body requires additional nutrition to help support your growing baby. You also have a higher need for some vitamins and minerals, such as folic acid, calcium, iron, and vitamin D. Eating a healthy, well-balanced diet is very important for your health  and your baby's health. Your need for extra calories varies over the course of your pregnancy. Pregnancy is divided into three trimesters, with each trimester lasting 3 months. For most women, it is recommended to consume: 150 extra calories a day during the first trimester. 300 extra calories a day during the second trimester. 300 extra calories a day during the third trimester. What are tips for following this plan? Cooking Practice good food safety and cleanliness. Wash your hands before you eat and after you prepare raw meat. Wash all fruits and vegetables well before peeling or eating. Taking these actions can help to prevent  foodborne illnesses that can be very dangerous to your baby, such as listeriosis. Ask your health care provider for more information about listeriosis. Make sure that all meats, poultry, and eggs are cooked to food-safe temperatures or "well-done." Meal planning  Eat a variety of foods (especially fruits and vegetables) to get a full range of vitamins and minerals. Two or more servings of fish are recommended each week in order to get the most benefits from omega-3 fatty acids that are found in seafood. Choose fish that are lower in mercury, such as salmon and pollock. Limit your overall intake of foods that have "empty calories." These are foods that have little nutritional value, such as sweets, desserts, candies, and sugar-sweetened beverages. Drinks that contain caffeine are okay to drink, but it is better to avoid caffeine. Keep your total caffeine intake to less than 200 mg each day (which is 12 oz or 355 mL of coffee, tea, or soda) or the limit as told by your health care provider. General information Do not try to lose weight or go on a diet during pregnancy. Take a prenatal vitamin to help meet your additional vitamin and mineral needs during pregnancy, specifically for folic acid, iron, calcium, and vitamin D. Remember to stay active. Ask your health care provider what types of exercise and activities are safe for you. What does 150 extra calories look like? Healthy options that provide 150 extra calories each day could be any of the following: 6-8 oz (170-227 g) plain low-fat yogurt with  cup (70 g) berries. 1 apple with 2 tsp (11 g) peanut butter. Cut-up vegetables with  cup (60 g) hummus. 8 fl oz (237 mL) low-fat chocolate milk. 1 stick of string cheese with 1 medium orange. 1 peanut butter and jelly sandwich that is made with one slice of whole-wheat bread and 1 tsp (5 g) of peanut butter. For 300 extra calories, you could eat two of these healthy options each day. What is a  healthy amount of weight to gain? The right amount of weight gain for you is based on your BMI (body mass index) before you became pregnant. If your BMI was less than 18 (underweight), you should gain 28-40 lb (13-18 kg). If your BMI was 18-24.9 (normal), you should gain 25-35 lb (11-16 kg). If your BMI was 25-29.9 (overweight), you should gain 15-25 lb (7-11 kg). If your BMI was 30 or greater (obese), you should gain 11-20 lb (5-9 kg). What if I am having twins or multiples? Generally, if you are carrying twins or multiples: You may need to eat 300-600 extra calories a day. The recommended range for total weight gain is 25-54 lb (11-25 kg), depending on your BMI before pregnancy. Talk with your health care provider to find out about nutritional needs, weight gain, and exercise that is right for you. What foods should I eat?  Fruits All fruits. Eat a variety of colors and types of fruit. Remember to wash your fruits well before peeling or eating. Vegetables All vegetables. Eat a variety of colors and types of vegetables. Remember to wash your vegetables well before peeling or eating. Grains All grains. Choose whole grains, such as whole-wheat bread, oatmeal, or brown rice. Meats and other protein foods Lean meats, including chicken, [place], and lean cuts of beef, veal, or pork. Fish that is higher in omega-3 fatty acids and lower in mercury, such as salmon, herring, mussels, trout, sardines, pollock, shrimp, crab, and lobster. Tofu. Tempeh. Beans. Eggs. Peanut butter and other nut butters. Dairy Pasteurized milk and milk alternatives, such as almond milk. Pasteurized yogurt and pasteurized cheese. Cottage cheese. Sour cream. Beverages Water. Juices that contain 100% fruit juice or vegetable juice. Caffeine-free teas and decaffeinated coffee. Fats and oils Fats and oils are okay to include in moderation. Sweets and desserts Sweets and desserts are okay to include in moderation. Seasoning  and other foods All pasteurized condiments. The items listed above may not be a complete list of foods and beverages you can eat. Contact a dietitian for more information. What foods should I avoid? Fruits Raw (unpasteurized) fruit juices. Vegetables Unpasteurized vegetable juices. Meats and other protein foods Precooked or cured meat, such as bologna, hot dogs, sausages, or meat loaves. (If you must eat those meats, reheat them until they are steaming hot.) Refrigerated pate, meat spreads from a meat counter, or smoked seafood that is found in the refrigerated section of a store. Raw or undercooked meats, poultry, and eggs. Raw fish, such as sushi or sashimi. Fish that have high mercury content, such as tilefish, shark, swordfish, and king mackerel. Dairy Unpasteurized milk and any foods that have unpasteurized milk in them. Soft cheeses, such as feta, queso blanco, queso fresco, [place], [place], panela, and blue-veined cheeses (unless they are made with pasteurized milk, which must be stated on the label). Beverages Alcohol. Sugar-sweetened beverages, such as sodas, teas, or energy drinks. Seasoning and other foods Homemade fermented foods and drinks, such as pickles, sauerkraut, or kombucha drinks. (Store-bought pasteurized versions of these are okay.) Salads that are made in a store or deli, such as ham salad, chicken salad, egg salad, tuna salad, and seafood salad. The items listed above may not be a complete list of foods and beverages you should avoid. Contact a dietitian for more information. Where to find more information To calculate the number of calories you need based on your height, weight, and activity level, you can use an online calculator such as: PayStrike.dk To calculate how much weight you should gain during pregnancy, you can use an online pregnancy weight gain calculator such as: http://www.harvey.com/ To learn more about eating fish during pregnancy, talk with  your health care provider or visit: PumpkinSearch.com.ee Summary While you are pregnant, your body requires additional nutrition to help support your growing baby. Eat a variety of foods, especially fruits and vegetables, to get a full range of vitamins and minerals. Practice good food safety and cleanliness. Wash your hands before you eat and after you prepare raw meat. Wash all fruits and vegetables well before peeling or eating. Taking these actions can help to prevent foodborne illnesses, such as listeriosis, that can be very dangerous to your baby. Do not eat raw meat or fish. Do not eat fish that have high mercury content, such as tilefish, shark, swordfish, and king mackerel. Do not eat raw (unpasteurized) dairy. Take a prenatal vitamin  to help meet your additional vitamin and mineral needs during pregnancy, specifically for folic acid, iron, calcium, and vitamin D. This information is not intended to replace advice given to you by your health care provider. Make sure you discuss any questions you have with your health care provider. Document Revised: 12/21/2019 Document Reviewed: 12/23/2019 Elsevier Patient Education  2024 [employer].

## 2023-09-10 NOTE — Progress Notes (Signed)
 [facility]  New Obstetric Patient H&P    Chief Complaint: "Desires prenatal care"   History of Present Illness: Patient is a 23 y.o. G2P1001 Not Hispanic or Latino female, presents with amenorrhea and positive home pregnancy test. Patient's last menstrual period was 06/17/2023 (exact date). and based on her  LMP, her EDD is Estimated Date of Delivery: 03/23/24 and her EGA is [redacted]w[redacted]d. Cycles are 5 days, regular, and occur approximately every : 28 days. Her last pap smear was 2 years ago and was no abnormalities.    She had a urine pregnancy test which was positive about 7 weeks ago. Her last menstrual period was normal and lasted for  5 day(s). Since her LMP she claims she has experienced breast tenderness, fatigue, nausea. She denies vaginal bleeding. 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 Her prior pregnancies are notable for  FT SVD, 6#13oz female.  Since her LMP, she admits to the use of tobacco products  she quit with +UPT She claims she has gained  10  pounds since the start of her pregnancy.  There are cats in the home in the home  no She admits close contact with children on a regular basis  yes  She has had chicken pox in the past unknown She has had Tuberculosis exposures, symptoms, or previously tested positive for TB   no Current or past history of domestic violence. no  Genetic Screening/Teratology Counseling: (Includes patient, baby's father, or anyone in either family with:)   1. Patient's age >/= 29 at [facility]  no 2. Thalassemia ([place], [place], Mediterranean, or Asian background): MCV<80  sickle cell screen from G1 pregnancy suggested possible alpha thalassemia 3. Neural tube defect (meningomyelocele, spina bifida, anencephaly)  no 4. Congenital heart defect  no  5. Down syndrome  no 6. Tay-Sachs (Jewish, [place]  no 7. Canavan's Disease  no 8. Sickle cell disease or trait (African)  no  9. Hemophilia  or other blood disorders  no  10. Muscular dystrophy  no  11. Cystic fibrosis  no  12. Huntington's Chorea  no  13. Mental retardation/autism  no 14. Other inherited genetic or chromosomal disorder  no 15. Maternal metabolic disorder (DM, PKU, etc)  no 16. Patient or FOB with a child with a birth defect not listed above no  16a. Patient or FOB with a birth defect themselves no 17. Recurrent pregnancy loss, or stillbirth  no  18. Any medications since LMP other than prenatal vitamins (include vitamins, supplements, OTC meds, drugs, alcohol)  no 19. Any other genetic/environmental exposure to discuss  no  Infection History:   1. Lives with someone with TB or TB exposed  no  2. Patient or partner has history of genital herpes  no 3. Rash or viral illness since LMP  no 4. History of STI (GC, CT, HPV, syphilis, HIV)  Hx CT, Trich, Syphillis (treated during 1st pregnancy) 5. History of recent travel :  no  Other pertinent information:  no     Review of Systems:10 point review of systems negative unless otherwise noted in HPI  Past Medical History:  Patient Active Problem List   Diagnosis Date Noted Date Diagnosed   Supervision of other normal pregnancy, antepartum 08/08/2023      Clinical Staff Provider  Office Location  [facility] Dating  Not found.  Language  English Anatomy US    Flu Vaccine  05/13/23 Genetic Screen  NIPS:  TDaP vaccine   Offer Hgb A1C or  GTT Early : Third trimester :   Covid Declined   LAB RESULTS   Rhogam  --/--/O POS Performed at [facility], [address] [place], [place] [address]  [phone])  Blood Type --/--/O POS Performed at [facility], [address] [place], [place] [address]  [phone])   RSV N/A Antibody NEG (04/18 0534)  Feeding Plan Breastfeed Rubella    Contraception Undecided RPR Reactive (04/18 0534)   Circumcision Yes HBsAg     Pediatrician  [facility] HIV    Support Person FOB Varicella     Prenatal Classes No GBS Negative/-- (03/20 1049)(For PCN allergy, check sensitivities)     Hep C     BTL Consent  Pap No results found for: "DIAGPAP"  VBAC Consent  Hgb Electro      CF      SMA             History of maternal syphilis, 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 Alvester Morin on 03/18/22 Needs referral to genetic counseling per Dr. Alvester Morin on 03/26/22 Referral written on 03/26/22  Genetic counseling 03/29/22= Increases in HbF during pregnancy are very characteristic and reach a peak in the second trimester. Difficult to determine if high HbF, with normal red cell  indices and normal HbA2 in a pregnant person is due to pregnancy or is due to primary hereditary persistence of fetal hemoglobin (HPFH).  The two conditions can be differentiated by DNA analysis or by re-testing the patient following delivery.  CBC indicated slightly low hemoglobin, MCV 72.7, MCH 23.1. These patterns can be consistent with alpha-thalassemia trait.  The patient accepted carrier screening for [facility] cell-free DNA option at this time.       cutter age 76 03/13/2022     PHQ-9=7    Obesity affecting pregnancy 06/13/2015     Recommendations [ ]  Aspirin 81 mg daily after 12 weeks; agrees to take; discontinue after 36 weeks [ ]  Nutrition consult [ ]  Weight gain 11-20 lbs for singleton and 25-35 lbs for twin pregnancy (IOM guidelines) Higher class of obesity patients recommended to gain closer to lower limit  Weight loss is associated with adverse outcomes [ ]  Baseline and  surveillance labs (pulled in from [facility], refresh links as needed)  Lab Results  Component Value Date   PLT 267 02/09/2022   CREATININE 0.72 02/09/2022   AST 22 02/09/2022   ALT 30 02/09/2022    Antenatal Testing: Not indicated.  [ ]  Growth scans every 4-6 weeks as needed (fundal height likely inadequate in morbidly obese patients) [ ]  @ [facility] start  NST 2x weekly at 36  Postpartum Care: [ ]  Consider prophylactic wound vac/PICO for C/S [ ]  Lovenox for DVT/PE prophylaxis (6 hours after vaginal delivery, 12 hours after C/S).   Lovenox 40 mg [place] q24h (BMI 30.0-39.9 kg/m2)  Lovenox 0.5 mg/kg [place] q12h ((BMI >=40 kg/m2 ); Max 150 mg [place] q12h.  Consider prolonged therapy x 6 weeks PP in very concerning patients (I.e morbid obesity with other co-morbidities that increase risk of DVT/PE) [ ]  Counsel about diet, exercise and weight loss. Referrals PRN.  ICD10 Codes: O99.210   Obesity in pregnancy (BMI 30.0-39.9 kg/m2)  O99.210, E66.01 Maternal Morbid Obesity (BMI >=40 kg/m2 ).**Have to use both codes, this is a HCC code and risk adjusts/more reimbursement**  Obesity is defined as body mass index (BMI) >=30 kg/m2 .  Class I (BMI 30.0 to 34.9 kg/m2) Class II (BMI 35.0 to 39.9 kg/m2) Class III/Morbid obesity (BMI >=40 kg/      Past Surgical History:  Past Surgical History:  Procedure Laterality Date   NO PAST SURGERIES      Gynecologic History: Patient's last menstrual period was 06/17/2023 (exact date).  Obstetric History: G2P1001  Family History:  Family History  Problem Relation Age of Onset   Hypertension Mother    Healthy Father    Healthy Sister    Healthy Maternal Grandmother    Healthy Maternal Grandfather    Cancer Paternal Grandmother 80       unknown type   Healthy Paternal Grandfather    Healthy Half-Brother    Healthy Half-Brother    Healthy Half-Brother    Healthy Half-Brother    Healthy Half-Brother    Healthy Half-Brother    Healthy Half-Brother    Prostate  cancer Maternal Great-grandfather     Social History:  Social History   Socioeconomic History   Marital status: Single    Spouse name: Not on file   Number of children: 1   Years of education: 12th   Highest education level: High school graduate  Occupational History   Occupation: warehouse  Tobacco Use   Smoking status: Former    Current packs/day: 0.00    Types: E-cigarettes, Cigarettes    Quit date: 01/2022    Years since quitting: 1.6    Passive exposure: Past Surveyor, minerals)   Smokeless tobacco: Never   Tobacco comments:    3 black and milds a day - stopped smoking in Aug 2023 after finding out of pregnancy   Vaping Use   Vaping status: Former   Quit date: 02/06/2022  Substance and Sexual Activity   Alcohol use: Not Currently    Comment: occasionally - Last ETOH 01/22/22   Drug use: Not Currently    Types: Marijuana    Comment: Last MJ use ~July 2023   Sexual activity: Yes    Partners: Male    Birth control/protection: None  Other Topics Concern   Not on file  Social History Narrative   Lives at home by herself. FOB is not supportive of pregnancy, no longer in a relationship. Has lots of support of family.    *Truddie Crumble is the dad as was here on 08/26/22   Social Drivers of Health   Financial Resource Strain: High Risk (08/08/2023)   Overall Financial Resource Strain (CARDIA)    Difficulty of Paying Living Expenses: Hard  Food Insecurity: Food Insecurity Present (08/08/2023)   Hunger Vital Sign    Worried About Running Out of Food in the Last Year: Sometimes true  Ran Out of Food in the Last Year: Sometimes true  Transportation Needs: No Transportation Needs (08/08/2023)   PRAPARE - Administrator, Civil Service (Medical): No    Lack of Transportation (Non-Medical): No  Physical Activity: Insufficiently Active (08/08/2023)   Exercise Vital Sign    Days of Exercise per Week: 2 days    Minutes of Exercise per Session: 10 min  Stress: No Stress Concern Present  (08/08/2023)   [employer] of Occupational Health - Occupational Stress Questionnaire    Feeling of Stress : Not at all  Social Connections: Moderately Isolated (08/08/2023)   Social Connection and Isolation Panel [NHANES]    Frequency of Communication with Friends and Family: More than three times a week    Frequency of Social Gatherings with Friends and Family: More than three times a week    Attends Religious Services: Never    Database administrator or Organizations: No    Attends Banker Meetings: Never    Marital Status: Living with partner  Intimate Partner Violence: Not At Risk (08/08/2023)   Humiliation, Afraid, Rape, and Kick questionnaire    Fear of Current or Ex-Partner: No    Emotionally Abused: No    Physically Abused: No    Sexually Abused: No    Allergies:  Allergies  Allergen Reactions   Shellfish Allergy Nausea And Vomiting and Other (See Comments)    "Funny feelings in throat"    Medications: Prior to Admission medications   Medication Sig Start Date End Date Taking? Authorizing Provider  Prenatal Vit-Fe Fumarate-FA (MULTIVITAMIN-PRENATAL) 27-0.8 MG TABS tablet Take 1 tablet by mouth daily at 12 noon.   Yes [provider]    Physical Exam Vitals: Blood pressure 109/70, pulse 93, weight 214 lb (97.1 kg), last menstrual period 06/17/2023  General: NAD HEENT: normocephalic, anicteric Thyroid: no enlargement, no palpable nodules Pulmonary: No increased work of breathing, CTAB Cardiovascular: RRR, distal pulses 2+ Abdomen: NABS, soft, non-tender, non-distended.  Umbilicus without lesions.  No evidence of hernia. FHTs by doppler 150's  Genitourinary: deferred for self swab aptima, PAP interval Extremities: no edema, erythema, or tenderness Neurologic: Grossly intact Psychiatric: mood appropriate, affect full   The following were addressed during this visit:  Breastfeeding Education - Early initiation of breastfeeding     Comments: Keeps milk supply adequate, helps contract uterus and slow bleeding, and early milk is the perfect first food and is easy to digest.   - The importance of exclusive breastfeeding    Comments: Provides antibodies, Lower risk of breast and ovarian cancers, and type-2 diabetes,Helps your body recover, Reduced chance of SIDS.   - Risks of giving your baby anything other than breast milk if you are breastfeeding    Comments: Make the baby less content with breastfeeds, may make my baby more susceptible to illness, and may reduce my milk supply.   - The importance of early skin-to-skin contact    Comments:  Keeps baby warm and secure, helps keep baby's blood sugar up and breathing steady, easier to bond and breastfeed, and helps calm baby.  - Rooming-in on a 24-hour basis    Comments: Easier to learn baby's feeding cues, easier to bond and get to know each other, and encourages milk production.   - Feeding on demand or baby-led feeding    Comments: Helps prevent breastfeeding complications, helps bring in good milk supply, prevents under or overfeeding, and helps baby feel content and satisfied   - Frequent  feeding to help assure optimal milk production    Comments: Making a full supply of milk requires frequent removal of milk from breasts, infant will eat 8-12 times in 24 hours, if separated from infant use breast massage, hand expression and/ or pumping to remove milk from breasts.   - Effective positioning and attachment    Comments: Helps my baby to get enough breast milk, helps to produce an adequate milk supply, and helps prevent nipple pain and damage   - Exclusive breastfeeding for the first 6 months    Comments: Builds a healthy milk supply and keeps it up, protects baby from sickness and disease, and breastmilk has everything your baby needs for the first 6 months.  - Individualized Education    Comments: Contraindications to breastfeeding and other special medical  conditions Did not breastfeed first baby. Is uncertain if she wants to breastfeed this time. Accepts initial education.     Assessment: 23 y.o. G2P1001 at [redacted]w[redacted]d presenting to initiate prenatal care  Plan: 1) Avoid alcoholic beverages. 2) Patient encouraged not to smoke.  3) Discontinue the use of all non-medicinal drugs and chemicals.  4) Take prenatal vitamins daily.  5) Nutrition, food safety (fish, cheese advisories, and high nitrite foods) and exercise discussed. 6) Hospital and practice style discussed with cross coverage system.  7) Genetic Screening, such as with 1st Trimester Screening, cell free fetal DNA, AFP testing, and Ultrasound, as well as with amniocentesis and CVS as appropriate, is discussed with patient. At the conclusion of today's visit patient requested genetic testing 8) Patient is asked about travel to areas at risk for the Zika virus, and counseled to avoid travel and exposure to mosquitoes or sexual partners who may have themselves been exposed to the virus. Testing is discussed, and will be ordered as appropriate.  9) NOB labs today including early Hgb A1C due to elevated BMI 10) Return to clinic in 4 weeks for ROB   Tresea Mall, CNM [facility] 09/10/2023 1:20 PM

## 2023-09-11 LAB — CERVICOVAGINAL ANCILLARY ONLY
Chlamydia: NEGATIVE
Comment: NEGATIVE
Comment: NORMAL
Neisseria Gonorrhea: NEGATIVE

## 2023-09-11 LAB — URINALYSIS, ROUTINE W REFLEX MICROSCOPIC
Bilirubin, UA: NEGATIVE
Glucose, UA: NEGATIVE
Ketones, UA: NEGATIVE
Leukocytes,UA: NEGATIVE
Nitrite, UA: NEGATIVE
RBC, UA: NEGATIVE
Specific Gravity, UA: 1.025 (ref 1.005–1.030)
Urobilinogen, Ur: 0.2 mg/dL (ref 0.2–1.0)
pH, UA: 7.5 (ref 5.0–7.5)

## 2023-09-12 LAB — NICOTINE SCREEN, URINE: Cotinine Ql Scrn, Ur: POSITIVE ng/mL — AB

## 2023-09-12 LAB — MONITOR DRUG PROFILE 14(MW)
Amphetamine Scrn, Ur: NEGATIVE ng/mL
BARBITURATE SCREEN URINE: NEGATIVE ng/mL
BENZODIAZEPINE SCREEN, URINE: NEGATIVE ng/mL
Buprenorphine, Urine: NEGATIVE ng/mL
CANNABINOIDS UR QL SCN: NEGATIVE ng/mL
Cocaine (Metab) Scrn, Ur: NEGATIVE ng/mL
Creatinine(Crt), U: 129.6 mg/dL (ref 20.0–300.0)
Fentanyl, Urine: NEGATIVE pg/mL
Meperidine Screen, Urine: NEGATIVE ng/mL
Methadone Screen, Urine: NEGATIVE ng/mL
OXYCODONE+OXYMORPHONE UR QL SCN: NEGATIVE ng/mL
Opiate Scrn, Ur: NEGATIVE ng/mL
Ph of Urine: 7.6 (ref 4.5–8.9)
Phencyclidine Qn, Ur: NEGATIVE ng/mL
Propoxyphene Scrn, Ur: NEGATIVE ng/mL
SPECIFIC GRAVITY: 1.027
Tramadol Screen, Urine: NEGATIVE ng/mL

## 2023-09-12 LAB — CULTURE, OB URINE

## 2023-09-12 LAB — URINE CULTURE, OB REFLEX

## 2023-09-13 LAB — RPR, QUANT+TP ABS (REFLEX)
Rapid Plasma Reagin, Quant: 1:8 {titer} — ABNORMAL HIGH
T Pallidum Abs: REACTIVE — AB

## 2023-09-13 LAB — CBC/D/PLT+RPR+RH+ABO+RUBIGG...
Antibody Screen: NEGATIVE
Basophils Absolute: 0.1 10*3/uL (ref 0.0–0.2)
Basos: 1 %
EOS (ABSOLUTE): 0.6 10*3/uL — ABNORMAL HIGH (ref 0.0–0.4)
Eos: 6 %
HCV Ab: NONREACTIVE
HIV Screen 4th Generation wRfx: NONREACTIVE
Hematocrit: 36.7 % (ref 34.0–46.6)
Hemoglobin: 12 g/dL (ref 11.1–15.9)
Hepatitis B Surface Ag: NEGATIVE
Immature Grans (Abs): 0.1 10*3/uL (ref 0.0–0.1)
Immature Granulocytes: 1 %
Lymphocytes Absolute: 1.7 10*3/uL (ref 0.7–3.1)
Lymphs: 20 %
MCH: 24.6 pg — ABNORMAL LOW (ref 26.6–33.0)
MCHC: 32.7 g/dL (ref 31.5–35.7)
MCV: 75 fL — ABNORMAL LOW (ref 79–97)
Monocytes Absolute: 0.7 10*3/uL (ref 0.1–0.9)
Monocytes: 8 %
Neutrophils Absolute: 5.7 10*3/uL (ref 1.4–7.0)
Neutrophils: 64 %
Platelets: 203 10*3/uL (ref 150–450)
RBC: 4.88 x10E6/uL (ref 3.77–5.28)
RDW: 18.2 % — ABNORMAL HIGH (ref 11.7–15.4)
RPR Ser Ql: REACTIVE — AB
Rh Factor: POSITIVE
Rubella Antibodies, IGG: <0.9 {index} — ABNORMAL LOW (ref >0.99)
Varicella zoster IgG: REACTIVE
WBC: 8.8 10*3/uL (ref 3.4–10.8)

## 2023-09-13 LAB — HCV INTERPRETATION

## 2023-09-13 LAB — HEMOGLOBIN A1C
Est. average glucose Bld gHb Est-mCnc: 85 mg/dL
Hgb A1c MFr Bld: 4.6 % — ABNORMAL LOW (ref 4.8–5.6)

## 2023-09-17 LAB — MATERNIT 21 PLUS CORE, BLOOD
Fetal Fraction: 20
Result (T21): NEGATIVE
Trisomy 13 (Patau syndrome): NEGATIVE
Trisomy 18 (Edwards syndrome): NEGATIVE
Trisomy 21 (Down syndrome): NEGATIVE

## 2023-09-21 ENCOUNTER — Encounter: Payer: Self-pay | Admitting: Advanced Practice Midwife

## 2023-10-08 ENCOUNTER — Ambulatory Visit (INDEPENDENT_AMBULATORY_CARE_PROVIDER_SITE_OTHER): Admitting: Obstetrics and Gynecology

## 2023-10-08 ENCOUNTER — Encounter: Payer: Self-pay | Admitting: Obstetrics and Gynecology

## 2023-10-08 VITALS — BP 105/70 | HR 109 | Wt 217.8 lb

## 2023-10-08 DIAGNOSIS — Z1379 Encounter for other screening for genetic and chromosomal anomalies: Secondary | ICD-10-CM | POA: Diagnosis not present

## 2023-10-08 DIAGNOSIS — Z3689 Encounter for other specified antenatal screening: Secondary | ICD-10-CM

## 2023-10-08 DIAGNOSIS — Z3A16 16 weeks gestation of pregnancy: Secondary | ICD-10-CM | POA: Diagnosis not present

## 2023-10-08 DIAGNOSIS — Z3482 Encounter for supervision of other normal pregnancy, second trimester: Secondary | ICD-10-CM

## 2023-10-08 NOTE — Progress Notes (Signed)
 ROB:  EGA = 16.1.  She reports no problems.  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  Referred to health department for treatment.  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  Ultrasound next visit.  aFP today.  Discussed aspirin again and recommended she begin this at once as she has not yet started it.  She says she will pick up this today.

## 2023-10-08 NOTE — Progress Notes (Signed)
 ROB. Patient states wanting AFP today. Anatomy ultrasound ordered.

## 2023-10-10 ENCOUNTER — Ambulatory Visit: Admitting: Nurse Practitioner

## 2023-10-10 ENCOUNTER — Encounter: Payer: Self-pay | Admitting: Nurse Practitioner

## 2023-10-10 DIAGNOSIS — Z113 Encounter for screening for infections with a predominantly sexual mode of transmission: Secondary | ICD-10-CM

## 2023-10-10 DIAGNOSIS — Z202 Contact with and (suspected) exposure to infections with a predominantly sexual mode of transmission: Secondary | ICD-10-CM | POA: Diagnosis not present

## 2023-10-10 LAB — AFP, SERUM, OPEN SPINA BIFIDA
AFP MoM: 1
AFP Value: 29.3 ng/mL
Gest. Age on Collection Date: 16.1 wk
Maternal Age At EDD: 25.1 a
OSBR Risk 1 IN: 10000
Test Results:: NEGATIVE
Weight: 218 [lb_av]

## 2023-10-10 LAB — HM HEPATITIS C SCREENING LAB: HM Hepatitis Screen: NEGATIVE

## 2023-10-10 LAB — HM HIV SCREENING LAB: HM HIV Screening: NEGATIVE

## 2023-10-10 MED ORDER — PENICILLIN G BENZATHINE 1200000 UNIT/2ML IM SUSY
2.4000 10*6.[IU] | PREFILLED_SYRINGE | Freq: Once | INTRAMUSCULAR | Status: AC
Start: 2023-10-10 — End: 2023-10-10
  Administered 2023-10-10: 2.4 10*6.[IU] via INTRAMUSCULAR

## 2023-10-10 NOTE — Progress Notes (Signed)
 Pt is here for std screening. Bicillin  given as ordered. Pt ambulated in hallway for 15 minutes and tolerated well. Caren Channel, RN

## 2023-10-11 LAB — SYPHILIS SEROLOGY, ~~LOC~~ LAB
RPR, Quant: 1:32 {titer}
RPR: REACTIVE
Syphilis Treponemal Ab: REACTIVE

## 2023-10-11 NOTE — Progress Notes (Signed)
 [facility] [address], [address] [place] [address] Main phone: [phone]  STI screening visit  Subjective:  Laura Higgins is a 23 y.o. female being seen today for an STI screening visit. The patient reports they do not have symptoms.  Patient reports that they  are currently pregnant.    Patient's last menstrual period was 06/17/2023 (exact date).-Currently pregnant.  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 Patient is currently [redacted]w[redacted]d pregnant with 2nd child. Patient has a known positive history of syphilis discovered during first pregnancy (03/2022) with a titer of 1:64. Per patient treated with 3 shots of bicillin  at that time.  Initial and repeat titers as follows: 03/2022= 1:64 (initial reactive titer), patient treated X 3 06/2022= 1:32 09/2022= 1:16 09/2023= 1:8 (most recent, found in 2nd pregnancy- reason for visit today)  Today the patient reports no symptoms including chancre, rash, neuro symptoms of vision changes, problems with balance or walking. She reports no new partner and no known contact or exposure.  With patient permission this provider contacted [place] [facility] to discuss the case and found out if more information was available. The information I have concurs with the state database.  Per the state  representative, a baseline of 1:8 is possible. Suggested repeat testing.   Patient reports 2 female partners in the last year. She reports notifying the father of her child about her reactive syphilis test and suggesting he get testing and treatment if necessary, but unknown if he did. Patient practices vaginal sex. She has a history of chlamydia and syphilis.     Last HIV test per patient/review of record was  Lab Results  Component Value Date   HMHIVSCREEN Negative - Validated 10/21/2018    Lab Results  Component Value Date   HIV Non Reactive 09/10/2023     Last HEPC test per patient/review of record was  Lab Results  Component Value Date   HMHEPCSCREEN Negative-Validated 10/21/2018    Last HEPB test per patient/review of record was No components found for: "HMHEPBSCREEN"   Patient reports last pap was:    Lab Results  Component Value Date   SPECADGYN Comment 03/13/2022   Result Date Procedure Results Follow-ups  03/13/2022 Pap IG (Image Guided) DIAGNOSIS:: Comment Specimen adequacy:: Comment Clinician Provided ICD10: Comment Performed by:: Comment PAP Smear Comment: . Note:: Comment Test Methodology: Comment     Screening for MPX risk: Does the patient have an unexplained rash? No Is the patient MSM? No Does the patient endorse multiple sex partners or anonymous sex partners? Yes Did the patient have close or sexual contact with a person diagnosed with MPX? No Has the patient traveled outside the [place]  where MPX is endemic? No Is there a high clinical suspicion for MPX-- evidenced by one of the following No  -Unlikely to be chickenpox  -Lymphadenopathy  -Rash that present in same phase  of evolution on any given body part See flowsheet for further details and programmatic requirements.   Immunization history:  Immunization History  Administered Date(s) Administered   HPV 9-valent 08/29/2015   Hepatitis A 11/28/2006, 08/29/2015   Hepatitis B 10-23-1998, 03/20/1999,  03/26/1999, 08/24/1999   Hpv-Unspecified 11/19/2013   Influenza-Unspecified 04/10/2022, 05/13/2023   MMR 09/29/2022   Tdap 02/01/2010, 07/16/2022     The following portions of the patient's history were reviewed and updated as appropriate: allergies, current medications, past medical history, past social history, past surgical history and problem list.  Objective:  There were no vitals filed for this visit.  Physical exam noted below is what could be gathered upon inspection during patient interview. Patient declined PE, including genital exam.  Physical Exam Nursing note reviewed.  Constitutional:      Appearance: Normal appearance.  HENT:     Head: Normocephalic.     Mouth/Throat:     Lips: Pink. No lesions.  Eyes:     General:        Right eye: No discharge.        Left eye: No discharge.  Pulmonary:     Effort: Pulmonary effort is normal.  Genitourinary:    Comments: Patient asymptomatic. Declines genital exam and declines vaginal swabs for STI testing.  Lymphadenopathy:     Head:     Right side of head: No submental, submandibular, tonsillar, preauricular or posterior auricular adenopathy.     Left side of head: No submental, submandibular, tonsillar, preauricular or posterior auricular adenopathy.  Skin:    General: Skin is warm and dry.     Comments: Skin tone appropriate for ethnicity. Assessed exposed areas only.  Neurological:     Mental Status: She is alert and oriented to person, place, and time.  Psychiatric:        Attention and Perception: Attention and perception normal.        Mood and Affect: Mood normal. Affect is tearful.        Speech: Speech normal.        Behavior: Behavior normal. Behavior is cooperative.        Thought Content: Thought content normal.    Assessment and Plan:  Laura Higgins is a 23 y.o. female presenting to the [facility] Department for STI screening  1. Screening for venereal disease (Primary) Obtaining  HIV, HCV, and HBV testing today as recent results could not be located within chart review.  Patient refused other testing and refused physical exam.  Repeating syphilis labs to note if titer remains at 1:8 or increases. If titer increases patient will need to return to clinic for a series of 3 bicillin  injections. If titer is higher and it has been longer than 10 days since injection today patient will need to begin series of 3 injections over. If titer is higher and it has not been more than 10 days since today's injection patient can continue with series and get 2 more injections as long as they are within 7-10 days of each other.  If titer returns the same or lower, patient will not need further treatment.  Encouraged patient to communicate test results with all partners and encourage them to get tested and treated as appropriate. Advised patient the [place] [facility] may be contacting her.  Patient verbalized understanding and agreed with plan.   - Syphilis Serology, [facility] - HBV Antigen/Antibody [facility] - HIV/HCV [place]  2. Syphilis contact Spoke with  Dr. Tempie Fee, M.D. by phone regarding this patient's case and the best path forward at this time for patient and fetus safety. Agreed the benefits outweigh the risks regarding treatment with bicillin  injection today.  Repeating syphilis labs to note if titer remains at 1:8 or increases. If titer increases patient will need to return to clinic for a series of 3 bicillin  injections. If titer is higher and it has been longer than 10 days since injection today patient will need to begin series of 3 injections over. If titer is higher and it has not been more than 10 days since today's injection patient can continue with series and get 2 more injections as long as they are within 7-10 days of each other.  If titer returns the same or lower, patient will not need further treatment.  Encouraged patient to communicate test results with all  partners and encourage them to get tested and treated as appropriate. Advised patient the [place] [facility] may be contacting her.  Patient verbalized understanding and agreed with plan.   - penicillin  g benzathine (BICILLIN  LA) 1200000 UNIT/2ML injection 2.4 Million Units   Patient accepted repeat serum lab screenings including HIV, HBV, HCV, and RPR with confirmatory testing. Patient meets criteria for HepB screening? Yes. Ordered? yes Patient meets criteria for HepC screening? Yes. Ordered? yes  Discussed time line for State Lab results and that patient will be called with positive results and encouraged patient to call if she had not heard in 2 weeks.  Counseled to return or seek care for new symptoms. Recommended repeat testing in 3 months with positive results. Recommended condom use with all sex for STI prevention.   Patient is currently pregnant.    Return for Will call patient if lab results indicate a need for further treatment. .  Future Appointments  Date Time Provider Department Center  10/24/2023  4:00 PM [facility]  [facility]  [facility]  11/05/2023 10:35 AM Free, Verita Glassman, CNM [facility] None   Total time with patient 30 minutes.   Merleen Stare, NP

## 2023-10-12 ENCOUNTER — Other Ambulatory Visit: Payer: Self-pay

## 2023-10-12 ENCOUNTER — Emergency Department
Admission: EM | Admit: 2023-10-12 | Discharge: 2023-10-12 | Disposition: A | Discharge status: Home / Self Care | Attending: Emergency Medicine | Admitting: Emergency Medicine

## 2023-10-12 DIAGNOSIS — X088XXA Exposure to other specified smoke, fire and flames, initial encounter: Secondary | ICD-10-CM | POA: Insufficient documentation

## 2023-10-12 DIAGNOSIS — R519 Headache, unspecified: Secondary | ICD-10-CM | POA: Insufficient documentation

## 2023-10-12 DIAGNOSIS — Z3A16 16 weeks gestation of pregnancy: Secondary | ICD-10-CM | POA: Diagnosis not present

## 2023-10-12 DIAGNOSIS — T59811A Toxic effect of smoke, accidental (unintentional), initial encounter: Secondary | ICD-10-CM | POA: Diagnosis not present

## 2023-10-12 DIAGNOSIS — O26892 Other specified pregnancy related conditions, second trimester: Secondary | ICD-10-CM | POA: Diagnosis not present

## 2023-10-12 DIAGNOSIS — O99352 Diseases of the nervous system complicating pregnancy, second trimester: Secondary | ICD-10-CM | POA: Diagnosis not present

## 2023-10-12 DIAGNOSIS — O9A212 Injury, poisoning and certain other consequences of external causes complicating pregnancy, second trimester: Secondary | ICD-10-CM | POA: Diagnosis not present

## 2023-10-12 DIAGNOSIS — J705 Respiratory conditions due to smoke inhalation: Secondary | ICD-10-CM | POA: Diagnosis not present

## 2023-10-12 MED ORDER — ACETAMINOPHEN 500 MG PO TABS: 500.0000 mg | ORAL_TABLET | Freq: Four times a day (QID) | ORAL | 0 refills | Status: AC | PRN

## 2023-10-12 MED ORDER — ACETAMINOPHEN 325 MG PO TABS
650.0000 mg | ORAL_TABLET | Freq: Once | ORAL | Status: AC
  Administered 2023-10-12: 650 mg via ORAL
  Filled 2023-10-12: qty 2
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
## 2023-10-14 ENCOUNTER — Ambulatory Visit (INDEPENDENT_AMBULATORY_CARE_PROVIDER_SITE_OTHER): Admitting: Obstetrics

## 2023-10-14 VITALS — BP 110/69 | HR 100 | Wt 215.0 lb

## 2023-10-14 DIAGNOSIS — O09892 Supervision of other high risk pregnancies, second trimester: Secondary | ICD-10-CM | POA: Diagnosis not present

## 2023-10-14 DIAGNOSIS — Z3A17 17 weeks gestation of pregnancy: Secondary | ICD-10-CM | POA: Diagnosis not present

## 2023-10-14 DIAGNOSIS — T59811A Toxic effect of smoke, accidental (unintentional), initial encounter: Secondary | ICD-10-CM

## 2023-10-14 LAB — HBV ANTIGEN/ANTIBODY STATE LAB
Hep B Core Total Ab: NONREACTIVE
Hep B S Ab: NONREACTIVE
Hepatitis B Surface Antigen: NONREACTIVE
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
## 2023-10-22 ENCOUNTER — Telehealth: Payer: Self-pay

## 2023-10-22 ENCOUNTER — Encounter: Payer: Self-pay | Admitting: Nurse Practitioner

## 2023-10-22 NOTE — Telephone Encounter (Addendum)
 Phone call to pt at [phone]. Pt answered and confirmed identity. Counseled pt re syphilis TR and need to restart tx x3.  Pt stated her partner needs tx; he is not currently with her; she will encourage him to call [facility] to schedule appt.  Discussed the need for injections every week/7 days for 3 weeks in a row.  She needs late afternoon appts due to her work schedule.   Bicillin  #1 tx appt scheduled for 10/27/23. ----  Tuesdays, late PM will work for pt: Bicillin  # 1 changed to Tuesday 10/28/23.

## 2023-10-22 NOTE — Telephone Encounter (Addendum)
 Bicillin  tx appointments scheduled:  #1: (scheduled for 10/28/23)-Completed 10/28/23 #2:  (scheduled for 11/04/23)- PT CANCELLED #3:  (scheduled for 11/11/23)

## 2023-10-22 NOTE — Telephone Encounter (Signed)
 Call pt re syphilis results from 10/10/23 specimen: RPR= Reactive Titer 1:32 TP= Reactive  (See orders in 10/10/23 document by Catarina Cline, NP:  Repeating syphilis labs to note if titer remains at 1:8 or increases. If titer increases patient will need to return to clinic for a series of 3 bicillin  injections. If titer is higher and it has been longer than 10 days since injection today patient will need to begin series of 3 injections over. If titer is higher and it has not been more than 10 days since today's injection patient can continue with series and get 2 more injections as long as they are within 7-10 days of each other. If titer returns the same or lower, patient will not need further treatment.)  It has been 10 days since bicillin  tx on 10/10/23.  Restart bicillin  tx x3.

## 2023-10-23 ENCOUNTER — Ambulatory Visit: Payer: Self-pay | Admitting: Family Medicine

## 2023-10-23 DIAGNOSIS — Z9229 Personal history of other drug therapy: Secondary | ICD-10-CM | POA: Insufficient documentation

## 2023-10-23 NOTE — Progress Notes (Signed)
 Covering for Catarina Cline, NP. Reviewed results. Positive syphilis, but patient has already been notified and scheduled for treatment. Hep B labs show no infection and that Ms. Popal is non-immune; would need repeat Hep B vaccinations if desired.   Tempie Fee, MD 10/23/23  11:59 AM

## 2023-10-24 ENCOUNTER — Ambulatory Visit
Admission: RE | Admit: 2023-10-24 | Discharge: 2023-10-24 | Disposition: A | Discharge status: Home / Self Care | Source: Ambulatory Visit | Attending: Obstetrics and Gynecology | Admitting: Obstetrics and Gynecology

## 2023-10-24 DIAGNOSIS — Z369 Encounter for antenatal screening, unspecified: Secondary | ICD-10-CM | POA: Diagnosis not present

## 2023-10-24 DIAGNOSIS — O321XX1 Maternal care for breech presentation, fetus 1: Secondary | ICD-10-CM | POA: Diagnosis not present

## 2023-10-24 DIAGNOSIS — Z3A18 18 weeks gestation of pregnancy: Secondary | ICD-10-CM | POA: Insufficient documentation

## 2023-10-24 DIAGNOSIS — Z3482 Encounter for supervision of other normal pregnancy, second trimester: Secondary | ICD-10-CM

## 2023-10-24 DIAGNOSIS — Z3689 Encounter for other specified antenatal screening: Secondary | ICD-10-CM | POA: Insufficient documentation

## 2023-10-24 DIAGNOSIS — O321XX0 Maternal care for breech presentation, not applicable or unspecified: Secondary | ICD-10-CM | POA: Insufficient documentation

## 2023-10-27 ENCOUNTER — Encounter

## 2023-10-28 ENCOUNTER — Ambulatory Visit

## 2023-10-28 DIAGNOSIS — A539 Syphilis, unspecified: Secondary | ICD-10-CM

## 2023-10-28 MED ORDER — PENICILLIN G BENZATHINE 1200000 UNIT/2ML IM SUSY
2.4000 10*6.[IU] | PREFILLED_SYRINGE | INTRAMUSCULAR | Status: AC
Start: 2023-10-28 — End: 2023-11-12
  Administered 2023-10-28 – 2023-11-12 (×3): 2.4 10*6.[IU] via INTRAMUSCULAR

## 2023-10-28 NOTE — Progress Notes (Signed)
 In nurse clinic for syphilis tx / bic #1. Voices no concerns. Injections given today per order by Claryce Cruel, NP dated 10/28/2023. Injections tolerated well. Patient walked for 15 minutes after injection; no problems. Patient scheduled for next injection 11/04/2023 at 4:10PM; patient aware.  Clare Critchley, RN

## 2023-11-04 ENCOUNTER — Encounter

## 2023-11-05 ENCOUNTER — Ambulatory Visit

## 2023-11-05 ENCOUNTER — Ambulatory Visit (INDEPENDENT_AMBULATORY_CARE_PROVIDER_SITE_OTHER)

## 2023-11-05 VITALS — BP 110/64 | HR 100 | Wt 219.7 lb

## 2023-11-05 DIAGNOSIS — A539 Syphilis, unspecified: Secondary | ICD-10-CM

## 2023-11-05 DIAGNOSIS — Z3480 Encounter for supervision of other normal pregnancy, unspecified trimester: Secondary | ICD-10-CM

## 2023-11-05 DIAGNOSIS — O09892 Supervision of other high risk pregnancies, second trimester: Secondary | ICD-10-CM | POA: Diagnosis not present

## 2023-11-05 DIAGNOSIS — Z3A20 20 weeks gestation of pregnancy: Secondary | ICD-10-CM

## 2023-11-05 NOTE — Telephone Encounter (Signed)
 Phone call to pt at [phone]. Went to messages stating, Voicemail not set up, try again later. Tried twice.  Sent MyChart message.

## 2023-11-05 NOTE — Progress Notes (Signed)
 In nurse clinic for syphilis tx / Bic #2. Patient reports doing okay with last injections. Voices no concerns. Injections given today per order by Claryce Cruel, NP dated 10/28/2023. Tolerated well; no problems. Patient walked for 10 minutes after injections; no issues noted. Patient scheduled for Bic #3 11/12/2023 at 4:10 PM; patient aware.   Clare Critchley, RN

## 2023-11-05 NOTE — Assessment & Plan Note (Addendum)
-   Reviewed normal anatomy ultrasound. Discussed follow up growth US  in 3rd trimester.  - Reviewed red flag warning signs anticipatory guidance for upcoming prenatal care.
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
## 2023-11-05 NOTE — Progress Notes (Signed)
    Return Prenatal Note   Assessment/Plan   Plan  23 y.o. G2P1001 at [redacted]w[redacted]d presents for follow-up OB visit. Reviewed prenatal record including previous visit note.  History of maternal syphilis, currently pregnant, second trimester - Received first dose of next found of bicillin  on 5/20. Missed appointment for 2nd dose yesterday due to car issues, plans to call to reschedule.   Supervision of other normal pregnancy, antepartum - Reviewed normal anatomy ultrasound. Discussed follow up growth US  in 3rd trimester.  - Reviewed red flag warning signs anticipatory guidance for upcoming prenatal care.    No orders of the defined types were placed in this encounter.  Return in about 4 weeks (around 12/03/2023) for ROB.   Future Appointments  Date Time Provider Department Center  11/11/2023  4:10 PM [facility] NURSE [facility] None  12/03/2023  2:35 PM Forestine Igo, CNM [facility] None    For next visit:  continue with routine prenatal care     Subjective   23 y.o. G2P1001 at [redacted]w[redacted]d presents for this follow-up prenatal visit.  Patient has no concerns, doing well.  Patient reports: Movement: Increased Contractions: Not present  Objective   Flow sheet Vitals: Pulse Rate: 100 BP: 110/64 Fundal Height: 20 cm Fetal Heart Rate (bpm): 155 Total weight gain: 15 lb 11.2 oz (7.121 kg)  General Appearance  No acute distress, well appearing, and well nourished Pulmonary   Normal work of breathing Neurologic   Alert and oriented to person, place, and time Psychiatric   Mood and affect within normal limits  Verita Glassman Adelin Ventrella, CNM  11/04/2509:32 AM

## 2023-11-06 NOTE — Telephone Encounter (Addendum)
 Pt presented to [facility] on 11/05/23 and resumed bicillin  tx.  Bicillin  #2 = completed-administered 11/05/23.  Bicillin  #3: (appt for 11/12/23)- completed 11/12/23

## 2023-11-11 ENCOUNTER — Encounter

## 2023-11-12 ENCOUNTER — Ambulatory Visit

## 2023-11-12 DIAGNOSIS — A539 Syphilis, unspecified: Secondary | ICD-10-CM

## 2023-11-12 NOTE — Progress Notes (Signed)
 In nurse clinic for syphilis tx/ Bic #3. Voices no concerns. Patient reports doing okay with last injections. Injections tolerated well; no issues. Patient encouraged to RTC for retesting in 6 months. Patient agreed and verbalizes understanding.   Clare Critchley, RN

## 2023-11-18 ENCOUNTER — Other Ambulatory Visit: Payer: Self-pay

## 2023-12-03 ENCOUNTER — Ambulatory Visit: Admitting: Certified Nurse Midwife

## 2023-12-03 ENCOUNTER — Encounter: Payer: Self-pay | Admitting: Certified Nurse Midwife

## 2023-12-03 VITALS — BP 126/80 | HR 102 | Wt 219.7 lb

## 2023-12-03 DIAGNOSIS — E669 Obesity, unspecified: Secondary | ICD-10-CM | POA: Diagnosis not present

## 2023-12-03 DIAGNOSIS — Z130 Encounter for screening for diseases of the blood and blood-forming organs and certain disorders involving the immune mechanism: Secondary | ICD-10-CM

## 2023-12-03 DIAGNOSIS — Z3A24 24 weeks gestation of pregnancy: Secondary | ICD-10-CM | POA: Diagnosis not present

## 2023-12-03 DIAGNOSIS — O99212 Obesity complicating pregnancy, second trimester: Secondary | ICD-10-CM | POA: Diagnosis not present

## 2023-12-03 DIAGNOSIS — O99210 Obesity complicating pregnancy, unspecified trimester: Secondary | ICD-10-CM

## 2023-12-03 DIAGNOSIS — Z3480 Encounter for supervision of other normal pregnancy, unspecified trimester: Secondary | ICD-10-CM

## 2023-12-03 DIAGNOSIS — Z131 Encounter for screening for diabetes mellitus: Secondary | ICD-10-CM

## 2023-12-03 DIAGNOSIS — Z113 Encounter for screening for infections with a predominantly sexual mode of transmission: Secondary | ICD-10-CM

## 2023-12-03 NOTE — Progress Notes (Signed)
    Return Prenatal Note   Subjective   23 y.o. G2P1001 at [redacted]w[redacted]d presents for this follow-up prenatal visit.  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 Considering IUD for contraception postpartum Patient reports: Movement: Present Contractions: Not present  Objective   Flow sheet Vitals: Pulse Rate: (!) 102 BP: 126/80 Fundal Height: 25 cm Fetal Heart Rate (bpm): 150 Total weight gain: 15 lb 11.2 oz (7.121 kg)  General Appearance  No acute distress, well appearing, and well nourished Pulmonary   Normal work of breathing Neurologic   Alert and oriented to person, place, and time Psychiatric   Mood and affect within normal limits  Assessment/Plan   Plan  23 y.o. G2P1001 at [redacted]w[redacted]d presents for follow-up OB visit. Reviewed prenatal record including previous visit note.  Supervision of other normal pregnancy, antepartum Red flag symptoms reviewed. Anticipatory guidance for 28w labs discussed. Work note limiting weight of lifting provided.  Obesity affecting pregnancy Growth ultrasound ordered      Orders Placed This Encounter  Procedures   US  OB Follow Up    Standing Status:   Future    Expected Date:   01/02/2024    Expiration Date:   12/02/2024    Reason for exam::   growth, obesity in pregnancy    Preferred imaging location?:   Internal   28 Week RH+Panel    Standing Status:   Future    Expected Date:   12/31/2023    Expiration Date:   03/30/2024   Return in 4 weeks (on 12/31/2023) for ROB & GDM screening.   Future Appointments  Date Time Provider Department Center  12/30/2023  9:40 AM [facility] LAB [facility] None  12/30/2023 10:15 AM Jayne Harlene CROME, CNM [facility] None  01/01/2024 11:15 AM [facility] US  1 [facility] None    For next visit:  ROB with 1 hour glucola, third trimester labs, and Tdap     Harlene CROME Jayne, CNM  06/25/253:09 PM

## 2023-12-03 NOTE — Patient Instructions (Signed)
 Gestational Diabetes Mellitus, Diagnosis Gestational diabetes mellitus, or gestational diabetes, is diabetes that some people get when pregnant. This condition usually happens at 24-28 weeks of pregnancy.  People with gestational diabetes have high blood sugar. If you do not get treated for this condition, it may cause problems for you and your baby. It goes away after you give birth but can happen again the next time you become pregnant. What are the causes? This condition is caused by changes in your body when you're pregnant. When these changes happen: The pancreas may not make enough insulin. The body may not use insulin in the right way. This is called insulin resistance. Insulin helps your body use sugar for energy. If your body doesn't have enough insulin or can't use the insulin that it has, extra sugars stay in your blood. This leads to high blood sugar (hyperglycemia). What increases the risk? Being older than age 93 when pregnant. Too much body weight. Having polycystic ovary syndrome (PCOS). Having someone with diabetes in your family. Having had this condition in the past. Being pregnant with more than one baby. What are the signs or symptoms? You may not have any symptoms. If you do have symptoms, they may include: Being thirsty often. Being hungry often. Needing to pee more often. These symptoms can be missed because they're similar to other symptoms of pregnancy. How is this diagnosed? This condition may be diagnosed based on your blood sugar level and how your body responds to glucose. This may be checked with an oral glucose tolerance test (OGTT). The test may be done: Early in pregnancy if you have risk factors. At 24-28 weeks of your pregnancy. How is this treated? To treat this condition, you may be told to: Eat a healthy diet. Get more exercise. Check your blood sugar often. Your health care provider will tell you what your target is. Take insulin and other  medicines. These are taken if needed. Work with a diabetes expert. Follow these instructions at home: Learn about your diabetes Ask your provider: How often should I check my blood sugar? Where do I get the equipment? What medicines do I need? When should I take them? Do I need to meet with an educator? Who can I call if I have questions? Where can I find a support group? General instructions Take medicines only as told by your provider. Stay at a healthy weight. Drink enough fluid to keep your pee (urine) pale yellow. Check your pee for ketones when sick and as told. Ketones in your pee is a sign that your body is using fat for energy because it's not making enough insulin. Wear an alert bracelet or carry a card that shows you have this condition. Keep all follow-up visits. Your provider needs to check your health and your baby's growth. Where to find more information Gestational Diabetes: American Diabetes Association (ADA): diabetes.org Gestational Diabetes and Pregnancy: Centers for Disease Control and Prevention (CDC): TonerPromos.no American Pregnancy Association: americanpregnancy.org [place] MyPlate: WrestlingReporter.dk Contact a health care provider if:  Your blood sugar is above your target for two tests in a row. You have a high blood sugar level and you also have ketones in your pee. You have been sick or have had a fever for 2 days or more and aren't getting better. You have any of these problems for more than 6 hours: You can't eat or drink. You vomit or feel like you may vomit. You have watery poop (diarrhea). Get help right away if:  You become confused or cannot think clearly. You have trouble breathing. You have chest pain. Your baby is moving less than usual. You have unusual discharge or bleeding from your vagina. You have cramping in your belly or have pain in your hips or lower back. You have symptoms of high blood pressure or preeclampsia. These include: A severe,  throbbing headache that doesn't go away. Sudden or extreme swelling of your face, hands, legs, or feet. Vision problems, such as: Seeing spots. Blurry vision. Sensitivity to light. These symptoms may be an emergency. Get help right away. Call 911. Do not wait to see if the symptoms will go away. Do not drive yourself to the hospital. This information is not intended to replace advice given to you by your health care provider. Make sure you discuss any questions you have with your health care provider. Document Revised: 02/27/2023 Document Reviewed: 09/06/2022 Elsevier Patient Education  2024 [employer] Second Trimester of Pregnancy  The second trimester of pregnancy is from week 14 through week 27. This is months 4 through 6 of pregnancy. During the second trimester: Morning sickness is less or has stopped. You may have more energy. You may feel hungry more often. At this time, your unborn baby is growing very fast. At the end of the sixth month, the unborn baby may be up to 12 inches long and weigh about 1 pounds. You will likely start to feel the baby move between 16 and 20 weeks of pregnancy. Body changes during your second trimester Your body continues to change during this time. The changes usually go away after your baby is born. Physical changes You will gain more weight. Your belly will get bigger. You may begin to get stretch marks on your hips, belly, and breasts. Your breasts will keep growing and may hurt. You may get dark spots or blotches on your face. A dark line from your belly button to the pubic area may appear. This line is called linea nigra. Your hair may grow faster and get thicker. Health changes You may have headaches. You may have heartburn. You may pee more often. You may have swollen, bulging veins (varicose veins). You may have trouble pooping (constipation), or swollen veins in the butt that can itch or get painful (hemorrhoids). You may have back  pain. This is caused by: Weight gain. Pregnancy hormones that are relaxing the joints in your pelvis. Follow these instructions at home: Medicines Talk to your health care provider if you're taking medicines. Ask if the medicines are safe to take during pregnancy. Your provider may change the medicines that you take. Do not take any medicines unless told to by your provider. Take a prenatal vitamin that has at least 600 micrograms (mcg) of folic acid. Do not use herbal medicines, illegal drugs, or medicines that are not approved by your provider. Eating and drinking While you're pregnant your body needs extra food for your growing baby. Talk with your provider about what to eat while pregnant. Activity Most women are able to exercise during pregnancy. Exercises may need to change as your pregnancy goes on. Talk to your provider about your activities and exercise routines. Relieving pain and discomfort Wear a good, supportive bra if your breasts hurt. Rest with your legs raised if you have leg cramps or low back pain. Take warm sitz baths to soothe pain from hemorrhoids. Use hemorrhoid cream if your provider says it's okay. Do not douche. Do not use tampons or scented pads. Do not  use hot tubs, steam rooms, or saunas. Safety Wear your seatbelt at all times when you're in a car. Talk to your provider if someone hits you, hurts you, or yells at you. Talk with your provider if you're feeling sad or have thoughts of hurting yourself. Lifestyle Certain things can be harmful while you're pregnant. It's best to avoid the following: Do not drink alcohol,smoke, vape, or use products with nicotine or tobacco in them. If you need help quitting, talk with your provider. Avoid cat litter boxes and soil used by cats. These things carry germs that can cause harm to your pregnancy and your baby. General instructions Keep all follow-up visits. It helps you and your unborn baby stay as healthy as  possible. Write down your questions. Take them to your prenatal visits. Your provider will: Talk with you about your overall health. Give you advice or refer you to specialists who can help with different needs, including: Prenatal education classes. Mental health and counseling. Foods and healthy eating. Ask for help if you need help with food. Where to find more information American Pregnancy Association: americanpregnancy.org [employer]necologists: acog.org Office on [employer] Health: TravelLesson.ca Contact a health care provider if: You have a headache that does not go away when you take medicine. You have any of these problems: You can't eat or drink. You throw up or feel like you may throw up. You have watery poop (diarrhea) for 2 days or more. You have pain when you pee or your pee smells bad. You have been sick for 2 days or more and are not getting better. Contact your provider right away if: You have any of these coming from your vagina: Abnormal discharge. Bad-smelling fluid. Bleeding. Your baby is moving less than usual. You have contractions, belly cramping, or have pain in your pelvis or lower back. You have symptoms of high blood pressure or preeclampsia. These include: A severe, throbbing headache that does not go away. Sudden or extreme swelling of your face, hands, legs, or feet. Vision problems: You see spots. You have blurry vision. Your eyes are sensitive to light. If you can't reach the provider, go to an urgent care or emergency room. Get help right away if: You faint, become confused, or can't think clearly. You have chest pain or trouble breathing. You have any kind of injury, such as from a fall or a car crash. These symptoms may be an emergency. Call 911 right away. Do not wait to see if the symptoms will go away. Do not drive yourself to the hospital. This information is not intended to replace advice given to you by  your health care provider. Make sure you discuss any questions you have with your health care provider. Document Revised: 02/27/2023 Document Reviewed: 09/27/2022 Elsevier Patient Education  2024 [employer].

## 2023-12-03 NOTE — Assessment & Plan Note (Signed)
Growth ultrasound ordered.

## 2023-12-03 NOTE — Assessment & Plan Note (Signed)
 Red flag symptoms reviewed. Anticipatory guidance for 28w labs discussed. Work note limiting weight of lifting provided.

## 2023-12-30 ENCOUNTER — Encounter: Payer: Self-pay | Admitting: Certified Nurse Midwife

## 2023-12-30 ENCOUNTER — Ambulatory Visit (INDEPENDENT_AMBULATORY_CARE_PROVIDER_SITE_OTHER): Admitting: Certified Nurse Midwife

## 2023-12-30 ENCOUNTER — Other Ambulatory Visit

## 2023-12-30 VITALS — BP 108/63 | HR 102 | Wt 223.9 lb

## 2023-12-30 DIAGNOSIS — Z3483 Encounter for supervision of other normal pregnancy, third trimester: Secondary | ICD-10-CM | POA: Diagnosis not present

## 2023-12-30 DIAGNOSIS — Z113 Encounter for screening for infections with a predominantly sexual mode of transmission: Secondary | ICD-10-CM

## 2023-12-30 DIAGNOSIS — Z130 Encounter for screening for diseases of the blood and blood-forming organs and certain disorders involving the immune mechanism: Secondary | ICD-10-CM

## 2023-12-30 DIAGNOSIS — Z3A28 28 weeks gestation of pregnancy: Secondary | ICD-10-CM

## 2023-12-30 DIAGNOSIS — Z131 Encounter for screening for diabetes mellitus: Secondary | ICD-10-CM | POA: Diagnosis not present

## 2023-12-30 DIAGNOSIS — Z3480 Encounter for supervision of other normal pregnancy, unspecified trimester: Secondary | ICD-10-CM

## 2023-12-30 NOTE — Assessment & Plan Note (Signed)
 Reviewed kick counts and preterm labor warning signs. Instructed to call office or come to hospital with persistent headache, vision changes, regular contractions, leaking of fluid, decreased fetal movement or vaginal bleeding.

## 2023-12-30 NOTE — Progress Notes (Signed)
    Return Prenatal Note   Subjective   23 y.o. G2P1001 at [redacted]w[redacted]d presents for this follow-up prenatal visit.  Patient feeling well, active baby. 1h GTT & 28w labs completed today. Patient reports: Movement: Present Contractions: Not present  Objective   Flow sheet Vitals: Pulse Rate: (!) 102 BP: 108/63 Fundal Height: 29 cm Fetal Heart Rate (bpm): 145 Total weight gain: 19 lb 14.4 oz (9.027 kg)  General Appearance  No acute distress, well appearing, and well nourished Pulmonary   Normal work of breathing Neurologic   Alert and oriented to person, place, and time Psychiatric   Mood and affect within normal limits  Assessment/Plan   Plan  23 y.o. G2P1001 at [redacted]w[redacted]d presents for follow-up OB visit. Reviewed prenatal record including previous visit note.  Supervision of other normal pregnancy, antepartum Reviewed kick counts and preterm labor warning signs. Instructed to call office or come to hospital with persistent headache, vision changes, regular contractions, leaking of fluid, decreased fetal movement or vaginal bleeding.      No orders of the defined types were placed in this encounter.  Return in 2 weeks (on 01/13/2024) for ROB.   Future Appointments  Date Time Provider Department Center  01/01/2024 11:15 AM [facility] US  1 [facility] None  01/15/2024  1:35 PM Lynda Bradley, CNM [facility] None    For next visit:  continue with routine prenatal care     Harlene LITTIE Cisco, CNM  12/29/2509:35 AM

## 2023-12-30 NOTE — Patient Instructions (Signed)
 Third Trimester of Pregnancy  The third trimester of pregnancy is from week 28 through week 40. This is months 7 through 9. The third trimester is a time when your baby is growing fast. Body changes during your third trimester Your body continues to change during this time. The changes usually go away after your baby is born. Physical changes You will continue to gain weight. You may get stretch marks on your hips, belly, and breasts. Your breasts will keep growing and may hurt. A yellow fluid (colostrum) may leak from your breasts. This is the first milk you're making for your baby. Your hair may grow faster and get thicker. In some cases, you may get hair loss. Your belly button may stick out. You may have more swelling in your hands, face, or ankles. Health changes You may have heartburn. You may feel short of breath. This is caused by the uterus that is now bigger. You may have more aches in the pelvis, back, or thighs. You may have more tingling or numbness in your hands, arms, and legs. You may pee more often. You may have trouble pooping (constipation) or swollen veins in the butt that can itch or get painful (hemorrhoids). Other changes You may have more problems sleeping. You may notice the baby moving lower in your belly (dropping). You may have more fluid coming from your vagina. Your joints may feel loose, and you may have pain around your pelvic bone. Follow these instructions at home: Medicines Take medicines only as told by your health care provider. Some medicines are not safe during pregnancy. Your provider may change the medicines that you take. Do not take any medicines unless told to by your provider. Take a prenatal vitamin that has at least 600 micrograms (mcg) of folic acid. Do not use herbal medicines, illegal drugs, or medicines that are not approved by your provider. Eating and drinking While you're pregnant your body needs additional nutrition to help  support your growing baby. Talk with your provider about your nutritional needs. Activity Most women are able to exercise regularly during pregnancy. Exercise routines may need to change at the end of your pregnancy. Talk to your provider about your activities and exercise routine. Relieving pain and discomfort Rest often with your legs raised if you have leg cramps or low back pain. Take warm sitz baths to soothe pain from hemorrhoids. Use hemorrhoid cream if your provider says it's okay. Wear a good, supportive bra if your breasts hurt. Do not use hot tubs, steam rooms, or saunas. Do not douche. Do not use tampons or scented pads. Safety Talk to your provider before traveling far distances. Wear your seatbelt at all times when you're in a car. Talk to your provider if someone hits you, hurts you, or yells at you. Preparing for birth To prepare for your baby: Take childbirth and breastfeeding classes. Visit the hospital and tour the maternity area. Buy a rear-facing car seat. Learn how to install it in your car. General instructions Avoid cat litter boxes and soil used by cats. These things carry germs that can cause harm to your pregnancy and your baby. Do not drink alcohol, smoke, vape, or use products with nicotine or tobacco in them. If you need help quitting, talk with your provider. Keep all follow-up visits for your third trimester. Your provider will do more exams and tests during this trimester. Write down your questions. Take them to your prenatal visits. Your provider also will: Talk with you about  your overall health. Give you advice or refer you to specialists who can help with different needs, including: Mental health and counseling. Foods and healthy eating. Ask for help if you need help with food. Where to find more information American Pregnancy Association: americanpregnancy.org [employer]ians [employer]necologists: acog.org Office on [employer] Health:  TravelLesson.ca Contact a health care provider if: You have a headache that does not go away when you take medicine. You have any of these problems: You can't eat or drink. You have nausea and vomiting. You have watery poop (diarrhea) for 2 days or more. You have pain when you pee, or your pee smells bad. You have been sick for 2 days or more and aren't getting better. Contact your provider right away if: You have any of these coming from your vagina: Abnormal discharge. Bad-smelling fluid. Bleeding. Your baby is moving less than usual. You have signs of labor: You have any contractions, belly cramping, or have pain in your pelvis or lower back before 37 weeks of pregnancy (preterm labor). You have regular contractions that are less than 5 minutes apart. Your water breaks. You have symptoms of high blood pressure or preeclampsia. These include: A severe, throbbing headache that does not go away. Sudden or extreme swelling of your face, hands, legs, or feet. Vision problems: You see spots. You have blurry vision. Your eyes are sensitive to light. If you can't reach your provider, go to an urgent care or emergency room. Get help right away if: You faint, become confused, or can't think clearly. You have chest pain or trouble breathing. You have any kind of injury, such as from a fall or a car crash. These symptoms may be an emergency. Call 911 right away. Do not wait to see if the symptoms will go away. Do not drive yourself to the hospital. This information is not intended to replace advice given to you by your health care provider. Make sure you discuss any questions you have with your health care provider. Document Revised: 02/27/2023 Document Reviewed: 09/27/2022 Elsevier Patient Education  2024 [employer].

## 2024-01-01 ENCOUNTER — Other Ambulatory Visit

## 2024-01-01 ENCOUNTER — Telehealth: Payer: Self-pay | Admitting: Certified Nurse Midwife

## 2024-01-01 LAB — 28 WEEK RH+PANEL
Basophils Absolute: 0 x10E3/uL (ref 0.0–0.2)
Basos: 0 %
EOS (ABSOLUTE): 0.4 x10E3/uL (ref 0.0–0.4)
Eos: 4 %
Gestational Diabetes Screen: 97 mg/dL (ref 70–139)
HIV Screen 4th Generation wRfx: NONREACTIVE
Hematocrit: 34.3 % (ref 34.0–46.6)
Hemoglobin: 10.9 g/dL — ABNORMAL LOW (ref 11.1–15.9)
Immature Grans (Abs): 0.1 x10E3/uL (ref 0.0–0.1)
Immature Granulocytes: 1 %
Lymphocytes Absolute: 1.8 x10E3/uL (ref 0.7–3.1)
Lymphs: 17 %
MCH: 25.2 pg — ABNORMAL LOW (ref 26.6–33.0)
MCHC: 31.8 g/dL (ref 31.5–35.7)
MCV: 79 fL (ref 79–97)
Monocytes Absolute: 0.9 x10E3/uL (ref 0.1–0.9)
Monocytes: 8 %
Neutrophils Absolute: 7.5 x10E3/uL — ABNORMAL HIGH (ref 1.4–7.0)
Neutrophils: 70 %
Platelets: 191 x10E3/uL (ref 150–450)
RBC: 4.32 x10E6/uL (ref 3.77–5.28)
RDW: 17.2 % — ABNORMAL HIGH (ref 11.7–15.4)
RPR Ser Ql: REACTIVE — AB
WBC: 10.7 x10E3/uL (ref 3.4–10.8)

## 2024-01-01 LAB — RPR, QUANT+TP ABS (REFLEX)
Rapid Plasma Reagin, Quant: 1:8 {titer} — ABNORMAL HIGH
T Pallidum Abs: REACTIVE — AB

## 2024-01-01 NOTE — Telephone Encounter (Signed)
 Reached out to pt to reschedule US  OB f/u that was scheduled on 01/01/2024 at 11:15.  Could not leave a message bc call could not be completed as dialed.

## 2024-01-02 ENCOUNTER — Ambulatory Visit: Payer: Self-pay | Admitting: Certified Nurse Midwife

## 2024-01-02 NOTE — Telephone Encounter (Signed)
 Pt has been scheduled at [facility] on 01/05/2024 at 1:00.

## 2024-01-05 ENCOUNTER — Ambulatory Visit
Admission: RE | Admit: 2024-01-05 | Discharge: 2024-01-05 | Disposition: A | Discharge status: Home / Self Care | Source: Ambulatory Visit | Attending: Certified Nurse Midwife | Admitting: Certified Nurse Midwife

## 2024-01-05 DIAGNOSIS — O99212 Obesity complicating pregnancy, second trimester: Secondary | ICD-10-CM | POA: Insufficient documentation

## 2024-01-05 DIAGNOSIS — Z3480 Encounter for supervision of other normal pregnancy, unspecified trimester: Secondary | ICD-10-CM | POA: Insufficient documentation

## 2024-01-05 DIAGNOSIS — E669 Obesity, unspecified: Secondary | ICD-10-CM | POA: Diagnosis not present

## 2024-01-05 DIAGNOSIS — Z3A29 29 weeks gestation of pregnancy: Secondary | ICD-10-CM | POA: Insufficient documentation

## 2024-01-05 DIAGNOSIS — O99213 Obesity complicating pregnancy, third trimester: Secondary | ICD-10-CM | POA: Diagnosis not present

## 2024-01-05 DIAGNOSIS — O99210 Obesity complicating pregnancy, unspecified trimester: Secondary | ICD-10-CM | POA: Diagnosis present

## 2024-01-14 NOTE — Progress Notes (Unsigned)
 Routine Prenatal Care Visit  Subjective  Laura Higgins is a 23 y.o. G2P1001 at [redacted]w[redacted]d being seen today for ongoing prenatal care.  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  ----------------------------------------------------------------------------------- Patient reports {sx:14538}.    .  .   [name] Fluid {Actions; denies/reports/admits to:19208}.  ----------------------------------------------------------------------------------- The following portions of the patient's history were reviewed and updated as appropriate: allergies, current medications, past family history, past medical history, past social history, past surgical history and problem list. Problem list updated.  Objective  Last menstrual period 06/17/2023, not currently breastfeeding. Pregravid weight 204 lb (92.5 kg) Total Weight Gain 19 lb 14.4 oz (9.027 kg) Urinalysis: Urine Protein    Urine Glucose    Fetal Status:            General:  Alert, oriented and cooperative. Patient is in no acute distress.  Skin: Skin is warm and dry. No rash noted.   Cardiovascular: Normal heart rate noted  Respiratory: Normal respiratory effort, no problems with respiration noted  Abdomen: Soft, gravid, appropriate for gestational age.       Pelvic:  {Blank single:19197::Cervical exam performed,Cervical exam deferred}        Extremities: Normal range of motion.     Mental Status: Normal mood and affect. Normal behavior. Normal judgment and thought content.   Assessment   23 y.o. G2P1001 at [redacted]w[redacted]d by   03/23/2024, by Last Menstrual Period presenting for {Blank single:19197::routine,work-in} prenatal visit  Plan   SECOND Problems (from 08/08/23 to present)     Problem Noted Diagnosed Resolved   Obesity affecting pregnancy 06/13/2015 by Thedora Jolynn Sauer, RN  No   Overview Addendum 03/13/2022 11:26 AM by Waylan Almarie LABOR, CNM  Recommendations [ ]  Aspirin 81 mg daily after 12 weeks; agrees to take; discontinue after 36 weeks [ ]  Nutrition consult [ ]  Weight gain 11-20 lbs for singleton and 25-35 lbs for twin pregnancy (IOM guidelines) Higher class of obesity patients recommended to gain closer to lower limit  Weight loss is associated with adverse outcomes [ ]  Baseline and surveillance labs (pulled in from [facility], refresh links as needed)  Lab Results  Component Value Date   PLT 267 02/09/2022   CREATININE 0.72 02/09/2022   AST 22 02/09/2022   ALT 30 02/09/2022    Antenatal Testing: Not indicated.  [ ]  Growth scans every 4-6 weeks as needed (fundal height likely inadequate in morbidly obese patients) [ ]  @ [facility] start NST 2x weekly at 36  Postpartum Care: [ ]  Consider prophylactic wound vac/PICO for C/S [ ]  Lovenox for DVT/PE prophylaxis (6 hours after vaginal delivery, 12 hours after C/S).   Lovenox 40 mg [place] q24h (BMI 30.0-39.9 kg/m2)  Lovenox 0.5 mg/kg [place] q12h ((BMI >=40 kg/m2 ); Max 150 mg [place] q12h.  Consider prolonged therapy x 6 weeks PP in very concerning patients (I.e morbid obesity with other co-morbidities that increase risk of DVT/PE) [ ]  Counsel about diet, exercise and weight loss. Referrals PRN.  ICD10 Codes: O99.210   Obesity in pregnancy (BMI 30.0-39.9 kg/m2)  O99.210, E66.01 Maternal Morbid Obesity (BMI >=40 kg/m2 ).**Have to use both codes, this is a HCC code and risk adjusts/more  reimbursement**  Obesity is defined as body mass index (BMI) >=30 kg/m2 .  Class I (BMI 30.0 to 34.9 kg/m2) Class II (BMI 35.0 to 39.9 kg/m2) Class III/Morbid obesity (BMI >=40  kg/            {Blank single:19197::Term,Preterm} labor symptoms and general obstetric precautions including but not limited to vaginal bleeding, contractions, leaking of fluid and fetal movement were reviewed in detail with the patient. Please refer to After Visit Summary for other counseling recommendations.   No follow-ups on file.  [place], [place] 06/12/7972 5:75 PM

## 2024-01-14 NOTE — Patient Instructions (Signed)
 Third Trimester of Pregnancy  The third trimester of pregnancy is from week 28 through week 40. This is months 7 through 9. The third trimester is a time when your baby is growing fast. Body changes during your third trimester Your body continues to change during this time. The changes usually go away after your baby is born. Physical changes You will continue to gain weight. You may get stretch marks on your hips, belly, and breasts. Your breasts will keep growing and may hurt. A yellow fluid (colostrum) may leak from your breasts. This is the first milk you're making for your baby. Your hair may grow faster and get thicker. In some cases, you may get hair loss. Your belly button may stick out. You may have more swelling in your hands, face, or ankles. Health changes You may have heartburn. You may feel short of breath. This is caused by the uterus that is now bigger. You may have more aches in the pelvis, back, or thighs. You may have more tingling or numbness in your hands, arms, and legs. You may pee more often. You may have trouble pooping (constipation) or swollen veins in the butt that can itch or get painful (hemorrhoids). Other changes You may have more problems sleeping. You may notice the baby moving lower in your belly (dropping). You may have more fluid coming from your vagina. Your joints may feel loose, and you may have pain around your pelvic bone. Follow these instructions at home: Medicines Take medicines only as told by your health care provider. Some medicines are not safe during pregnancy. Your provider may change the medicines that you take. Do not take any medicines unless told to by your provider. Take a prenatal vitamin that has at least 600 micrograms (mcg) of folic acid. Do not use herbal medicines, illegal drugs, or medicines that are not approved by your provider. Eating and drinking While you're pregnant your body needs additional nutrition to help  support your growing baby. Talk with your provider about your nutritional needs. Activity Most women are able to exercise regularly during pregnancy. Exercise routines may need to change at the end of your pregnancy. Talk to your provider about your activities and exercise routine. Relieving pain and discomfort Rest often with your legs raised if you have leg cramps or low back pain. Take warm sitz baths to soothe pain from hemorrhoids. Use hemorrhoid cream if your provider says it's okay. Wear a good, supportive bra if your breasts hurt. Do not use hot tubs, steam rooms, or saunas. Do not douche. Do not use tampons or scented pads. Safety Talk to your provider before traveling far distances. Wear your seatbelt at all times when you're in a car. Talk to your provider if someone hits you, hurts you, or yells at you. Preparing for birth To prepare for your baby: Take childbirth and breastfeeding classes. Visit the hospital and tour the maternity area. Buy a rear-facing car seat. Learn how to install it in your car. General instructions Avoid cat litter boxes and soil used by cats. These things carry germs that can cause harm to your pregnancy and your baby. Do not drink alcohol, smoke, vape, or use products with nicotine or tobacco in them. If you need help quitting, talk with your provider. Keep all follow-up visits for your third trimester. Your provider will do more exams and tests during this trimester. Write down your questions. Take them to your prenatal visits. Your provider also will: Talk with you about  your overall health. Give you advice or refer you to specialists who can help with different needs, including: Mental health and counseling. Foods and healthy eating. Ask for help if you need help with food. Where to find more information American Pregnancy Association: americanpregnancy.org [employer]ians [employer]necologists: acog.org Office on [employer] Health:  TravelLesson.ca Contact a health care provider if: You have a headache that does not go away when you take medicine. You have any of these problems: You can't eat or drink. You have nausea and vomiting. You have watery poop (diarrhea) for 2 days or more. You have pain when you pee, or your pee smells bad. You have been sick for 2 days or more and aren't getting better. Contact your provider right away if: You have any of these coming from your vagina: Abnormal discharge. Bad-smelling fluid. Bleeding. Your baby is moving less than usual. You have signs of labor: You have any contractions, belly cramping, or have pain in your pelvis or lower back before 37 weeks of pregnancy (preterm labor). You have regular contractions that are less than 5 minutes apart. Your water breaks. You have symptoms of high blood pressure or preeclampsia. These include: A severe, throbbing headache that does not go away. Sudden or extreme swelling of your face, hands, legs, or feet. Vision problems: You see spots. You have blurry vision. Your eyes are sensitive to light. If you can't reach your provider, go to an urgent care or emergency room. Get help right away if: You faint, become confused, or can't think clearly. You have chest pain or trouble breathing. You have any kind of injury, such as from a fall or a car crash. These symptoms may be an emergency. Call 911 right away. Do not wait to see if the symptoms will go away. Do not drive yourself to the hospital. This information is not intended to replace advice given to you by your health care provider. Make sure you discuss any questions you have with your health care provider. Document Revised: 02/27/2023 Document Reviewed: 09/27/2022 Elsevier Patient Education  2024 [employer].

## 2024-01-15 ENCOUNTER — Encounter: Payer: Self-pay | Admitting: Advanced Practice Midwife

## 2024-01-15 ENCOUNTER — Ambulatory Visit (INDEPENDENT_AMBULATORY_CARE_PROVIDER_SITE_OTHER): Admitting: Advanced Practice Midwife

## 2024-01-15 VITALS — BP 106/58 | HR 107 | Wt 217.5 lb

## 2024-01-15 DIAGNOSIS — Z3A30 30 weeks gestation of pregnancy: Secondary | ICD-10-CM | POA: Diagnosis not present

## 2024-01-15 DIAGNOSIS — Z3480 Encounter for supervision of other normal pregnancy, unspecified trimester: Secondary | ICD-10-CM | POA: Diagnosis not present

## 2024-01-30 ENCOUNTER — Encounter: Admitting: Advanced Practice Midwife

## 2024-02-10 ENCOUNTER — Encounter: Payer: Self-pay | Admitting: Obstetrics & Gynecology

## 2024-02-10 ENCOUNTER — Ambulatory Visit (INDEPENDENT_AMBULATORY_CARE_PROVIDER_SITE_OTHER): Admitting: Obstetrics & Gynecology

## 2024-02-10 VITALS — BP 112/72 | HR 123 | Wt 218.0 lb

## 2024-02-10 DIAGNOSIS — Z3A34 34 weeks gestation of pregnancy: Secondary | ICD-10-CM

## 2024-02-10 DIAGNOSIS — Z3480 Encounter for supervision of other normal pregnancy, unspecified trimester: Secondary | ICD-10-CM

## 2024-02-10 DIAGNOSIS — E669 Obesity, unspecified: Secondary | ICD-10-CM

## 2024-02-10 DIAGNOSIS — O99213 Obesity complicating pregnancy, third trimester: Secondary | ICD-10-CM | POA: Diagnosis not present

## 2024-02-10 DIAGNOSIS — O99210 Obesity complicating pregnancy, unspecified trimester: Secondary | ICD-10-CM

## 2024-02-10 NOTE — Progress Notes (Signed)
   PRENATAL VISIT NOTE  Subjective:  Laura Higgins is a 23 y.o. G2P1001 at [redacted]w[redacted]d being seen today for ongoing prenatal care.  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  Patient reports no complaints.   . Vag. Bleeding: None.  Movement: Present. Denies leaking of fluid.   The following portions of the patient's history were reviewed and updated as appropriate: allergies, current medications, past family history, past medical history, past social history, past surgical history and problem list.   Objective:    Vitals:   02/10/24 1509  BP: 112/72  Pulse: (!) 123  Weight: 218 lb (98.9 kg)    Fetal Status:  Fetal Heart Rate (bpm): 150 Fundal Height: 34 cm Movement: Present    General: Alert, oriented and cooperative. Patient is in no acute distress.  Skin: Skin is warm and dry. No rash noted.   Cardiovascular: Normal heart rate noted  Respiratory: Normal respiratory effort, no problems with respiration noted  Abdomen: Soft, gravid, appropriate for gestational age.  Pain/Pressure: Absent     Pelvic: Cervical exam deferred        Extremities: Normal range of motion.     Mental Status: Normal mood and affect. Normal behavior. Normal judgment and thought content.   Assessment and Plan:  Pregnancy: G2P1001 at [redacted]w[redacted]d 1. Supervision of other normal pregnancy, antepartum (Primary)   2. [redacted] weeks gestation of pregnancy   3. Obesity affecting pregnancy, antepartum, unspecified obesity type - on baby asa  Preterm labor symptoms and general obstetric precautions including but not limited to vaginal  bleeding, contractions, leaking of fluid and fetal movement were reviewed in detail with the patient. Please refer to After Visit Summary for other counseling recommendations.   Return in about 2 weeks (around 02/24/2024) for ROB, GBS at next visit.  No future appointments.  Harland JAYSON Birkenhead, MD

## 2024-02-24 ENCOUNTER — Encounter: Payer: Self-pay | Admitting: Obstetrics

## 2024-02-24 ENCOUNTER — Other Ambulatory Visit (HOSPITAL_COMMUNITY)
Admission: RE | Admit: 2024-02-24 | Discharge: 2024-02-24 | Disposition: A | Discharge status: Home / Self Care | Source: Ambulatory Visit | Attending: Obstetrics | Admitting: Obstetrics

## 2024-02-24 ENCOUNTER — Ambulatory Visit (INDEPENDENT_AMBULATORY_CARE_PROVIDER_SITE_OTHER): Admitting: Obstetrics

## 2024-02-24 VITALS — BP 102/73 | HR 111 | Wt 221.7 lb

## 2024-02-24 DIAGNOSIS — Z3A36 36 weeks gestation of pregnancy: Secondary | ICD-10-CM | POA: Diagnosis not present

## 2024-02-24 DIAGNOSIS — Z3480 Encounter for supervision of other normal pregnancy, unspecified trimester: Secondary | ICD-10-CM | POA: Diagnosis not present

## 2024-02-24 DIAGNOSIS — Z3685 Encounter for antenatal screening for Streptococcus B: Secondary | ICD-10-CM | POA: Diagnosis not present

## 2024-02-24 DIAGNOSIS — Z113 Encounter for screening for infections with a predominantly sexual mode of transmission: Secondary | ICD-10-CM | POA: Diagnosis not present

## 2024-02-24 DIAGNOSIS — O99213 Obesity complicating pregnancy, third trimester: Secondary | ICD-10-CM | POA: Diagnosis not present

## 2024-02-24 DIAGNOSIS — E669 Obesity, unspecified: Secondary | ICD-10-CM | POA: Diagnosis not present

## 2024-02-24 NOTE — Progress Notes (Signed)
    Return Prenatal Note   Subjective  23 y.o. G2P1001 at [redacted]w[redacted]d presents for this follow-up prenatal visit. Pregnancy notable for elevated BMI, possible syphilis reinfection with history of prior infection, Hepatitis B non-converter s/p vaccination, rubella non-immune.   Patient is well today, here with partner. Is getting ready for delivery, planning her hospital bag. Declines Flu and RSV vaccines.   Patient reports: Movement: Present Contractions: Not present Denies vaginal bleeding or leaking fluid. Objective  Flow sheet Vitals: Pulse Rate: (!) 111 BP: 102/73 Fundal Height: 36 cm Fetal Heart Rate (bpm): 147 Presentation: Vertex (BSUS) Total weight gain: 17 lb 11.2 oz (8.029 kg)  General Appearance  No acute distress, well appearing, and well nourished Pulmonary   Normal work of breathing Neurologic   Alert and oriented to person, place, and time Psychiatric   Mood and affect within normal limits   Assessment/Plan   Plan  23 y.o. G2P1001 at [redacted]w[redacted]d presents for follow-up OB visit. Reviewed prenatal record including previous visit note. 1. Supervision of other normal pregnancy, antepartum (Primary) - GBS and GCCT swabs done today - Declines Flu & RSV vaccines, counseling done - PTL precautions  2. Obesity affecting pregnancy - PG-BMI 38.57; TWG 17#, appropriate - 3rd trimester growth US  63%Ile - Start weekly NSTs at 37wks   Orders Placed This Encounter  Procedures   Culture, beta strep (group b only)   Return in about 1 week (around 03/02/2024) for ROB w/NST.   Future Appointments  Date Time Provider Department Center  03/03/2024 10:15 AM AOB-NST ROOM [facility] None  03/03/2024 10:35 AM Lynda Bradley, CNM [facility] None  03/10/2024  1:15 PM AOB-NST ROOM [facility] None  03/10/2024  1:55 PM Dominic, Jinnie Jansky, CNM [facility] None  03/17/2024  1:15 PM AOB-NST ROOM [facility] None  03/17/2024  1:55 PM Justino Eleanor HERO, CNM [facility] None  03/22/2024  2:15 PM AOB-NST ROOM [facility]  None  03/22/2024  2:35 PM Slaughterbeck, Damien, CNM [facility] None    For next visit:  continue with routine prenatal care    Estil Mangle, DO [facility] [place]

## 2024-02-26 LAB — CERVICOVAGINAL ANCILLARY ONLY
Chlamydia: NEGATIVE
Comment: NEGATIVE
Comment: NORMAL
Neisseria Gonorrhea: NEGATIVE

## 2024-02-27 LAB — CULTURE, BETA STREP (GROUP B ONLY): Strep Gp B Culture: POSITIVE — AB

## 2024-03-01 ENCOUNTER — Ambulatory Visit: Payer: Self-pay | Admitting: Obstetrics

## 2024-03-02 NOTE — Patient Instructions (Signed)
 Signs and Symptoms of Labor Labor is the body's natural process of moving the baby and the placenta out of the uterus. The process of labor usually starts when the baby is full-term, between 27 and 41 weeks of pregnancy. Signs and symptoms that you are close to going into labor As your body prepares for labor and the birth of your baby, you may notice the following symptoms in the weeks and days before true labor starts: Passing a small amount of thick, bloody mucus from your vagina. This is called normal bloody show or losing your mucus plug. This may happen more than a week before labor begins, or right before labor begins, as the opening of the cervix starts to widen (dilate). For some women, the entire mucus plug passes at once. For others, pieces of the mucus plug may gradually pass over several days. Your baby moving (dropping) lower in your pelvis to get into position for birth (lightening). When this happens, you may feel more pressure on your bladder and pelvic bone and less pressure on your ribs. This may make it easier to breathe. It may also cause you to need to urinate more often and have problems with bowel movements. Having practice contractions, also called Braxton Hicks contractions or false labor. These occur at irregular (unevenly spaced) intervals that are more than 10 minutes apart. False labor contractions are common after exercise or sexual activity. They will stop if you change position, rest, or drink fluids. These contractions are usually mild and do not get stronger over time. They may feel like: A backache or back pain. Mild cramps, similar to menstrual cramps. Tightening or pressure in your abdomen. Other early symptoms include: Nausea or loss of appetite. Diarrhea. Having a sudden burst of energy, or feeling very tired. Mood changes. Having trouble sleeping. Signs and symptoms that labor has begun Signs that you are in labor may include: Having contractions that come  at regular (evenly spaced) intervals and increase in intensity. This may feel like more intense tightening or pressure in your abdomen that moves to your back. Contractions may also feel like rhythmic pain in your upper thighs or back that comes and goes at regular intervals. If you are delivering for the first time, this change in intensity of contractions often occurs at a more gradual pace. If you have given birth before, you may notice a more rapid progression of contraction changes. Feeling pressure in the vaginal area. Your water breaking (rupture of membranes). This is when the sac of fluid that surrounds your baby breaks. Fluid leaking from your vagina may be clear or blood-tinged. Labor usually starts within 24 hours of your water breaking, but it may take longer to begin. Some people may feel a sudden gush of fluid; others may notice repeatedly damp underwear. Follow these instructions at home:  When labor starts, or if your water breaks, call your health care provider or nurse care line. Based on your situation, they will determine when you should go in for an exam. During early labor, you may be able to rest and manage symptoms at home. Some strategies to try at home include: Breathing and relaxation techniques. Taking a warm bath or shower. Listening to music. Using a heating pad on the lower back for pain. If directed, apply heat to the area as often as told by your health care provider. Use the heat source that your health care provider recommends, such as a moist heat pack or a heating pad. Place a  towel between your skin and the heat source. Leave the heat on for 20-30 minutes. Remove the heat if your skin turns bright red. This is especially important if you are unable to feel pain, heat, or cold. You have a greater risk of getting burned. Contact a health care provider if: Your labor has started. Your water breaks. You have nausea, vomiting, or diarrhea. Get help right away  if: You have painful, regular contractions that are 5 minutes apart or less. Labor starts before you are [redacted] weeks along in your pregnancy. You have a fever. You have bright red blood coming from your vagina. You do not feel your baby moving. You have a severe headache with or without vision problems. You have chest pain or shortness of breath. These symptoms may represent a serious problem that is an emergency. Do not wait to see if the symptoms will go away. Get medical help right away. Call your local emergency services (911 in the [place] Do not drive yourself to the hospital. Summary Labor is your body's natural process of moving your baby and the placenta out of your uterus. The process of labor usually starts when your baby is full-term, between 59 and 40 weeks of pregnancy. When labor starts, or if your water breaks, call your health care provider or nurse care line. Based on your situation, they will determine when you should go in for an exam. This information is not intended to replace advice given to you by your health care provider. Make sure you discuss any questions you have with your health care provider. Document Revised: 10/10/2020 Document Reviewed: 10/10/2020 Elsevier Patient Education  2024 [employer] Nonstress Test: What to Expect A nonstress test, also called an NST, is done during pregnancy to check your baby's heartbeat. The procedure can help to show if your baby is healthy. It may be done if: Your due date has passed. Your pregnancy is high risk. Your baby is moving less than normal. You've lost a previous pregnancy. Your baby is growing slowly. There's too much or too little fluid around your baby. The NST may be done in the third trimester to find out if it's best for your baby to be born early. During an NST, your baby's heartbeat is watched for at least 20 minutes. If the baby is healthy, the heart rate will go up when the baby moves and will return to normal  when the baby rests. This should happen at least twice during the test. Tell a health care provider about: Any allergies you have. Any medical problems you have. All medicines you take. These include vitamins, herbs, eye drops, and creams. Any surgeries you've had. Any past pregnancies you've had. What are the risks? There are no risks to you or your baby from a nonstress test. This procedure shouldn't be painful or uncomfortable. What happens before? Eat a meal right before the test or as told by your health care team. Food may help the baby to move. Use the restroom right before the test. What happens during a nonstress test?  Two monitors will be placed on your belly. One will check your baby's heart rate, and the other will check for contractions. You may be asked to lie down on your side or to sit up. You may be given a button to press when you feel your baby move. If your baby seems to be sleeping, you may be asked to drink some juice or soda, eat a snack, or change positions.  These steps may vary. Ask what you can expect. What can I expect after? Your team will talk with you about the results and tell you the next steps. If your team gave you any diet or activity instructions, make sure to follow them. Keep all follow-up visits. This is important to check on your health and the health of your baby. This information is not intended to replace advice given to you by your health care provider. Make sure you discuss any questions you have with your health care provider. Document Revised: 05/22/2023 Document Reviewed: 05/22/2023 Elsevier Patient Education  2025 [employer].

## 2024-03-03 ENCOUNTER — Ambulatory Visit (INDEPENDENT_AMBULATORY_CARE_PROVIDER_SITE_OTHER): Admitting: Advanced Practice Midwife

## 2024-03-03 ENCOUNTER — Other Ambulatory Visit

## 2024-03-03 VITALS — BP 93/58 | HR 111 | Wt 220.7 lb

## 2024-03-03 DIAGNOSIS — O99213 Obesity complicating pregnancy, third trimester: Secondary | ICD-10-CM | POA: Diagnosis not present

## 2024-03-03 DIAGNOSIS — Z3480 Encounter for supervision of other normal pregnancy, unspecified trimester: Secondary | ICD-10-CM

## 2024-03-03 DIAGNOSIS — E669 Obesity, unspecified: Secondary | ICD-10-CM

## 2024-03-03 DIAGNOSIS — Z3A37 37 weeks gestation of pregnancy: Secondary | ICD-10-CM | POA: Diagnosis not present

## 2024-03-03 DIAGNOSIS — Z3483 Encounter for supervision of other normal pregnancy, third trimester: Secondary | ICD-10-CM

## 2024-03-03 DIAGNOSIS — O99210 Obesity complicating pregnancy, unspecified trimester: Secondary | ICD-10-CM

## 2024-03-03 NOTE — Progress Notes (Signed)
 Routine Prenatal Care Visit  Subjective  Laura Higgins is a 23 y.o. G2P1001 at [redacted]w[redacted]d being seen today for ongoing prenatal care.  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  ----------------------------------------------------------------------------------- Patient reports she is doing well. No questions or concerns. Reviewed induction at 41 weeks if not in labor prior.   Contractions: Not present. Vag. Bleeding: None.  Movement: Present. Leaking Fluid denies.  ----------------------------------------------------------------------------------- The following portions of the patient's history were reviewed and updated as appropriate: allergies, current medications, past family history, past medical history, past social history, past surgical history and problem list. Problem list updated.  Objective  Blood pressure (!) 93/58, pulse (!) 111, weight 220 lb 11.2 oz (100.1 kg), last menstrual period 06/17/2023, not currently breastfeeding. Pregravid weight 204 lb (92.5 kg) Total Weight Gain 16 lb 11.2 oz (7.575 kg) Urinalysis: Urine Protein    Urine Glucose    Fetal Status:     Movement: Present     Fetus A Non-Stress Test Interpretation for 03/03/24  Indication: Obesity  Fetal Heart Rate A Mode: External Baseline Rate (A): 140 bpm Variability: Moderate Accelerations: 15 x 15 Decelerations: None Multiple birth?: No  Uterine Activity Mode: Toco Contraction Frequency (min): Rare UI Contraction Quality: Mild  Interpretation (Fetal Testing) Nonstress Test Interpretation: Reactive Overall  Impression: Reassuring for gestational age   General:  Alert, oriented and cooperative. Patient is in no acute distress.  Skin: Skin is warm and dry. No rash noted.   Cardiovascular: Normal heart rate noted  Respiratory: Normal respiratory effort, no problems with respiration noted  Abdomen: Soft, gravid, appropriate for gestational age. Pain/Pressure: Absent     Pelvic:  Cervical exam deferred        Extremities: Normal range of motion.  Edema: None  Mental Status: Normal mood and affect. Normal behavior. Normal judgment and thought content.   Assessment   23 y.o. G2P1001 at [redacted]w[redacted]d by  03/23/2024, by Last Menstrual Period presenting for routine prenatal visit  Plan   SECOND Problems (from 08/08/23 to present)     Problem Noted Diagnosed Resolved   Supervision of other normal pregnancy, antepartum 08/08/2023 by Loralyn Sander, CMA  No   Overview Addendum 03/03/2024 10:24 AM by Taft Salines, LPN   Clinical Staff Provider  Office Location  [facility] Dating  By LMP  Language  English Anatomy US   10/24/23 & f/u 01/05/24 WNL  Flu Vaccine  05/13/23 Genetic Screen  NIPS: negative/female  TDaP vaccine   At 32 wk  Hgb A1C or  GTT Early : 4.6 Third trimester : 97  Covid Declined   LAB RESULTS   Rhogam  O/Positive/-- (04/02 1029)  Blood Type O/Positive/-- (04/02 1029)   RSV N/A Antibody Negative (04/02 1029)  Feeding Plan Breastfeed Rubella <0.90 (04/02 1029)  Contraception Nexplanon RPR Reactive (07/22 1040)   Circumcision Yes HBsAg Negative (04/02 1029)   Pediatrician  [facility] HIV Non Reactive (07/22 1040)  Support Person FOB Varicella Reactive (04/02 1029)  Prenatal Classes No GBS Positive/-- (09/16 1616)(For PCN allergy, check sensitivities)     Hep C Non Reactive (04/02 1029)   BTL Consent  Pap No results found for: DIAGPAP  VBAC Consent NA  Hgb Electro  Possible alpha thalassemia    CF      SMA               Obesity affecting pregnancy 06/13/2015 by Thedora Jolynn Sauer, RN  No   Overview Addendum 03/13/2022 11:26 AM by Waylan Almarie LABOR, CNM  Recommendations [ ]  Aspirin 81 mg daily after 12 weeks; agrees to take; discontinue after 36 weeks [ ]  Nutrition consult [ ]  Weight gain 11-20 lbs for singleton and 25-35 lbs for twin pregnancy (IOM guidelines) Higher class of obesity patients recommended to gain closer to lower limit  Weight loss is associated with adverse outcomes [ ]  Baseline and surveillance labs (pulled in from [facility], refresh links as needed)  Lab Results  Component Value Date   PLT 267 02/09/2022   CREATININE 0.72 02/09/2022   AST 22 02/09/2022   ALT 30 02/09/2022    Antenatal Testing: Not indicated.  [ ]  Growth scans every 4-6 weeks as needed (fundal height likely inadequate in morbidly obese patients) [ ]  @ [facility] start NST 2x weekly at 36  Postpartum Care: [ ]  Consider prophylactic wound vac/PICO for C/S [ ]  Lovenox for DVT/PE prophylaxis (6 hours after vaginal delivery, 12 hours after C/S).   Lovenox 40 mg [place] q24h (BMI 30.0-39.9 kg/m2)  Lovenox 0.5 mg/kg [place] q12h ((BMI >=40 kg/m2 ); Max 150 mg [place] q12h.  Consider prolonged therapy x 6 weeks PP in very concerning patients (I.e morbid obesity with other co-morbidities that increase risk of DVT/PE) [ ]  Counsel about diet, exercise and weight loss. Referrals PRN.  ICD10 Codes: O99.210   Obesity in pregnancy (BMI 30.0-39.9 kg/m2)  O99.210, E66.01 Maternal Morbid Obesity (BMI >=40 kg/m2 ).**Have to use both codes, this is a HCC code and risk adjusts/more reimbursement**  Obesity is defined as body mass index (BMI) >=30 kg/m2 .  Class I (BMI 30.0 to 34.9 kg/m2) Class II (BMI 35.0 to 39.9 kg/m2) Class III/Morbid obesity (BMI >=40 kg/            Term labor symptoms and general obstetric precautions including but not limited to vaginal bleeding, contractions, leaking of fluid and fetal movement were reviewed in detail with the patient. Please refer to After Visit Summary for  other counseling recommendations.   Return in about 1 week (around 03/10/2024) for ROB NST.  Mathis LITTIE Getting, CMA 03/03/2024 10:33 AM

## 2024-03-10 ENCOUNTER — Ambulatory Visit (INDEPENDENT_AMBULATORY_CARE_PROVIDER_SITE_OTHER): Admitting: Licensed Practical Nurse

## 2024-03-10 ENCOUNTER — Other Ambulatory Visit

## 2024-03-10 VITALS — BP 117/82 | HR 116 | Wt 219.3 lb

## 2024-03-10 DIAGNOSIS — O99213 Obesity complicating pregnancy, third trimester: Secondary | ICD-10-CM | POA: Diagnosis not present

## 2024-03-10 DIAGNOSIS — Z6841 Body Mass Index (BMI) 40.0 and over, adult: Secondary | ICD-10-CM

## 2024-03-10 DIAGNOSIS — E669 Obesity, unspecified: Secondary | ICD-10-CM | POA: Diagnosis not present

## 2024-03-10 DIAGNOSIS — O0993 Supervision of high risk pregnancy, unspecified, third trimester: Secondary | ICD-10-CM | POA: Diagnosis not present

## 2024-03-10 DIAGNOSIS — Z3A38 38 weeks gestation of pregnancy: Secondary | ICD-10-CM | POA: Diagnosis not present

## 2024-03-10 DIAGNOSIS — O0990 Supervision of high risk pregnancy, unspecified, unspecified trimester: Secondary | ICD-10-CM

## 2024-03-10 DIAGNOSIS — Z3483 Encounter for supervision of other normal pregnancy, third trimester: Secondary | ICD-10-CM

## 2024-03-10 NOTE — Progress Notes (Addendum)
    Return Prenatal Note   Subjective   23 y.o. G2P1001 at [redacted]w[redacted]d presents for this follow-up prenatal visit.  Patient  Patient reports: Doing ok, admits her mood has been up and down, is under stress related to housing-she may be be evicted-working on securing housing. Also thinking about what it will be like to have a 23 y/o and newborn to care for at the same time. Encouraged to talk with her partner and family about her concerns and devise a plan.  -Feels done with pregnancy, would like an IOL when able. Open to membrane sweep when able.  Movement: Present Contractions: Not present  Objective   Flow sheet Vitals: Pulse Rate: (!) 116 BP: 117/82 Fundal Height: 40 cm Fetal Heart Rate (bpm): 135 Presentation: Vertex Dilation: 1 Effacement (%): Thick Station: Ballotable Total weight gain: 15 lb 4.8 oz (6.94 kg)  IVA MONTELONGO 10/14/2000 [redacted]w[redacted]d  Fetus A Non-Stress Test Interpretation for 03/10/24  Indication: BMI  Fetal Heart Rate A Mode: External Baseline Rate (A): 140 bpm Variability: Moderate Accelerations: 15 x 15 Decelerations: None     Interpretation (Fetal Testing) Nonstress Test Interpretation: Reactive Overall Impression: Reassuring for gestational age    General Appearance  No acute distress, well appearing, and well nourished Pulmonary   Normal work of breathing Neurologic   Alert and oriented to person, place, and time Psychiatric   Mood and affect within normal limits   Assessment/Plan   Plan  23 y.o. G2P1001 at [redacted]w[redacted]d presents for follow-up OB visit. Reviewed prenatal record including previous visit note.  Obesity affecting pregnancy -TWG 15lbs, WNL  -RNST   Supervision of high risk pregnancy, antepartum -Her partner, mother and his mother will be her labor support -She hs family to watch their son while in labor and for PP support -Will discuss scheduling IOL at next visit -warning signs reviewed.       Orders Placed This Encounter   Procedures   Fetal nonstress test   No follow-ups on file.   Future Appointments  Date Time Provider Department Center  03/17/2024  1:15 PM AOB-NST ROOM [facility] None  03/17/2024  1:55 PM Justino Setter M, CNM [facility] None  03/22/2024  2:15 PM AOB-NST ROOM [facility] None  03/22/2024  2:35 PM Slaughterbeck, Damien, CNM [facility] None    For next visit:  ROB with NST     Nakeshia Waldeck M Travoris Bushey, CNM  10/01/252:37 PM

## 2024-03-10 NOTE — Assessment & Plan Note (Signed)
-  TWG 15lbs, WNL  -RNST

## 2024-03-10 NOTE — Assessment & Plan Note (Signed)
-  Her partner, mother and his mother will be her labor support -She hs family to watch their son while in labor and for PP support -Will discuss scheduling IOL at next visit -warning signs reviewed.

## 2024-03-16 NOTE — Progress Notes (Unsigned)
    Return Prenatal Note   Assessment/Plan   Plan  23 y.o. G2P1001 at [redacted]w[redacted]d presents for follow-up OB visit. Reviewed prenatal record including previous visit note.  Supervision of other normal pregnancy, antepartum -RNST today -Discussed timing of IOL. Can schedule at next visit if still pregnant. Desires sweep at next visit if cervix is open. -Encouraged abdominal binder, squats, walking -Needs RPR at next visit -Reviewed labor warning signs. Instructed to call office or come to hospital with persistent headache, vision changes, regular contractions, leaking of fluid, decreased fetal movement or vaginal bleeding.      Orders Placed This Encounter  Procedures   Fetal nonstress test   Return in about 1 week (around 03/24/2024).   Future Appointments  Date Time Provider Department Center  03/22/2024  2:15 PM AOB-NST ROOM [facility] None  03/22/2024  2:35 PM Higgins, Laura, CNM [facility] None    For next visit:  ROB with NST    Subjective  Laura Higgins had regular, painful ctx this AM that have since slowed down. She is ready to meet the baby!  Movement: Present Contractions: Irritability  Objective   Flow sheet Vitals: Pulse Rate: (!) 116 BP: 117/74 Fundal Height: 39 cm Fetal Heart Rate (bpm): see NST Presentation: Vertex Dilation: 1 Effacement (%): Thick Station: -3 Total weight gain: 19 lb 1.6 oz (8.664 kg)  General Appearance  No acute distress, well appearing, and well nourished Pulmonary   Normal work of breathing Neurologic   Alert and oriented to person, place, and time Psychiatric   Mood and affect within normal limits    Non-Stress Test Interpretation Indication: BMI  Fetal Heart Rate A Mode: External Baseline Rate (A): 130 bpm Variability: Moderate Accelerations: 15 x 15 Decelerations: None Multiple birth?: No  Uterine Activity Mode: Toco Contraction Quality: Mild  Interpretation (Fetal Testing) Nonstress Test Interpretation:  Reactive Overall Impression: Reassuring for gestational age    Laura Higgins, [place] 03/17/24 3:16 PM

## 2024-03-17 ENCOUNTER — Encounter: Payer: Self-pay | Admitting: Obstetrics

## 2024-03-17 ENCOUNTER — Other Ambulatory Visit

## 2024-03-17 ENCOUNTER — Ambulatory Visit (INDEPENDENT_AMBULATORY_CARE_PROVIDER_SITE_OTHER): Admitting: Obstetrics

## 2024-03-17 VITALS — BP 117/74 | HR 116 | Wt 223.1 lb

## 2024-03-17 DIAGNOSIS — Z3483 Encounter for supervision of other normal pregnancy, third trimester: Secondary | ICD-10-CM | POA: Diagnosis not present

## 2024-03-17 DIAGNOSIS — Z3689 Encounter for other specified antenatal screening: Secondary | ICD-10-CM | POA: Diagnosis not present

## 2024-03-17 DIAGNOSIS — Z3A39 39 weeks gestation of pregnancy: Secondary | ICD-10-CM | POA: Diagnosis not present

## 2024-03-17 DIAGNOSIS — Z3480 Encounter for supervision of other normal pregnancy, unspecified trimester: Secondary | ICD-10-CM

## 2024-03-17 NOTE — Assessment & Plan Note (Addendum)
-  RNST today -Discussed timing of IOL. Can schedule at next visit if still pregnant. Desires sweep at next visit if cervix is open. -Encouraged abdominal binder, squats, walking -Needs RPR at next visit -Reviewed labor warning signs. Instructed to call office or come to hospital with persistent headache, vision changes, regular contractions, leaking of fluid, decreased fetal movement or vaginal bleeding.

## 2024-03-18 ENCOUNTER — Inpatient Hospital Stay
Admission: EM | Admit: 2024-03-18 | Discharge: 2024-03-22 | DRG: 807 | Disposition: A | Discharge status: Home / Self Care | Attending: Certified Nurse Midwife | Admitting: Certified Nurse Midwife

## 2024-03-18 ENCOUNTER — Observation Stay
Admission: EM | Admit: 2024-03-18 | Discharge: 2024-03-18 | Disposition: A | Discharge status: Home / Self Care | Source: Home / Self Care | Admitting: Certified Nurse Midwife

## 2024-03-18 ENCOUNTER — Other Ambulatory Visit: Payer: Self-pay

## 2024-03-18 ENCOUNTER — Encounter: Payer: Self-pay | Admitting: Obstetrics

## 2024-03-18 DIAGNOSIS — Z3A39 39 weeks gestation of pregnancy: Secondary | ICD-10-CM | POA: Diagnosis not present

## 2024-03-18 DIAGNOSIS — O09892 Supervision of other high risk pregnancies, second trimester: Secondary | ICD-10-CM

## 2024-03-18 DIAGNOSIS — O479 False labor, unspecified: Secondary | ICD-10-CM | POA: Diagnosis not present

## 2024-03-18 DIAGNOSIS — O26893 Other specified pregnancy related conditions, third trimester: Secondary | ICD-10-CM

## 2024-03-18 DIAGNOSIS — Z2839 Other underimmunization status: Secondary | ICD-10-CM

## 2024-03-18 DIAGNOSIS — R109 Unspecified abdominal pain: Secondary | ICD-10-CM | POA: Diagnosis not present

## 2024-03-18 DIAGNOSIS — Z3480 Encounter for supervision of other normal pregnancy, unspecified trimester: Secondary | ICD-10-CM

## 2024-03-18 DIAGNOSIS — O99824 Streptococcus B carrier state complicating childbirth: Secondary | ICD-10-CM | POA: Diagnosis not present

## 2024-03-18 DIAGNOSIS — Z7982 Long term (current) use of aspirin: Secondary | ICD-10-CM | POA: Diagnosis not present

## 2024-03-18 DIAGNOSIS — O4292 Full-term premature rupture of membranes, unspecified as to length of time between rupture and onset of labor: Secondary | ICD-10-CM | POA: Diagnosis not present

## 2024-03-18 DIAGNOSIS — Z91013 Allergy to seafood: Secondary | ICD-10-CM | POA: Diagnosis not present

## 2024-03-18 DIAGNOSIS — O471 False labor at or after 37 completed weeks of gestation: Principal | ICD-10-CM | POA: Insufficient documentation

## 2024-03-18 DIAGNOSIS — E66813 Obesity, class 3: Secondary | ICD-10-CM | POA: Diagnosis present

## 2024-03-18 DIAGNOSIS — O9902 Anemia complicating childbirth: Secondary | ICD-10-CM | POA: Diagnosis not present

## 2024-03-18 DIAGNOSIS — O99214 Obesity complicating childbirth: Secondary | ICD-10-CM | POA: Diagnosis present

## 2024-03-18 DIAGNOSIS — Z87891 Personal history of nicotine dependence: Secondary | ICD-10-CM | POA: Diagnosis not present

## 2024-03-18 DIAGNOSIS — Z3490 Encounter for supervision of normal pregnancy, unspecified, unspecified trimester: Secondary | ICD-10-CM

## 2024-03-18 DIAGNOSIS — O99210 Obesity complicating pregnancy, unspecified trimester: Principal | ICD-10-CM

## 2024-03-18 DIAGNOSIS — I4949 Other premature depolarization: Secondary | ICD-10-CM | POA: Diagnosis not present

## 2024-03-18 LAB — CBC
HCT: 31.2 % — ABNORMAL LOW (ref 36.0–46.0)
Hemoglobin: 10.2 g/dL — ABNORMAL LOW (ref 12.0–15.0)
MCH: 23.4 pg — ABNORMAL LOW (ref 26.0–34.0)
MCHC: 32.7 g/dL (ref 30.0–36.0)
MCV: 71.7 fL — ABNORMAL LOW (ref 80.0–100.0)
Platelets: 222 K/uL (ref 150–400)
RBC: 4.35 MIL/uL (ref 3.87–5.11)
RDW: 18.6 % — ABNORMAL HIGH (ref 11.5–15.5)
WBC: 10.1 K/uL (ref 4.0–10.5)
nRBC: 0 % (ref 0.0–0.2)

## 2024-03-18 LAB — RUPTURE OF MEMBRANE (ROM)PLUS: Rom Plus: POSITIVE

## 2024-03-18 LAB — TYPE AND SCREEN
ABO/RH(D): O POS
Antibody Screen: NEGATIVE

## 2024-03-18 MED ORDER — EPHEDRINE 5 MG/ML INJ: 10.0000 mg | INTRAVENOUS | Status: DC | PRN

## 2024-03-18 MED ORDER — PHENYLEPHRINE 80 MCG/ML (10ML) SYRINGE FOR IV PUSH (FOR BLOOD PRESSURE SUPPORT): 80.0000 ug | PREFILLED_SYRINGE | INTRAVENOUS | Status: DC | PRN

## 2024-03-18 MED ORDER — OXYTOCIN 10 UNIT/ML IJ SOLN
INTRAMUSCULAR | Status: AC
  Filled 2024-03-18: qty 2

## 2024-03-18 MED ORDER — PENICILLIN G POT IN DEXTROSE 60000 UNIT/ML IV SOLN
3.0000 10*6.[IU] | INTRAVENOUS | Status: DC
  Administered 2024-03-19 (×5): 3 10*6.[IU] via INTRAVENOUS
  Filled 2024-03-18 (×5): qty 50

## 2024-03-18 MED ORDER — ACETAMINOPHEN 325 MG PO TABS: 650.0000 mg | ORAL_TABLET | ORAL | Status: DC | PRN

## 2024-03-18 MED ORDER — ACETAMINOPHEN 325 MG PO TABS
650.0000 mg | ORAL_TABLET | ORAL | Status: DC | PRN
  Administered 2024-03-18: 650 mg via ORAL
  Filled 2024-03-18: qty 2

## 2024-03-18 MED ORDER — LIDOCAINE HCL (PF) 1 % IJ SOLN
INTRAMUSCULAR | Status: AC
  Filled 2024-03-18: qty 30

## 2024-03-18 MED ORDER — SODIUM CHLORIDE 0.9 % IV SOLN
5.0000 10*6.[IU] | Freq: Once | INTRAVENOUS | Status: AC
  Administered 2024-03-18: 5 10*6.[IU] via INTRAVENOUS
  Filled 2024-03-18: qty 5

## 2024-03-18 MED ORDER — SOD CITRATE-CITRIC ACID 500-334 MG/5ML PO SOLN: 30.0000 mL | ORAL | Status: DC | PRN

## 2024-03-18 MED ORDER — FENTANYL-BUPIVACAINE-NACL 0.5-0.125-0.9 MG/250ML-% EP SOLN
12.0000 mL/h | EPIDURAL | Status: DC | PRN
  Administered 2024-03-19: 10 mL/h via EPIDURAL
  Administered 2024-03-19: 12 mL/h via EPIDURAL
  Filled 2024-03-18 (×2): qty 250

## 2024-03-18 MED ORDER — DIPHENHYDRAMINE HCL 50 MG/ML IJ SOLN
12.5000 mg | INTRAMUSCULAR | Status: DC | PRN
  Administered 2024-03-19: 12.5 mg via INTRAVENOUS
  Filled 2024-03-18: qty 1

## 2024-03-18 MED ORDER — LIDOCAINE HCL (PF) 1 % IJ SOLN: 30.0000 mL | INTRAMUSCULAR | Status: DC | PRN

## 2024-03-18 MED ORDER — OXYTOCIN BOLUS FROM INFUSION
333.0000 mL | Freq: Once | INTRAVENOUS | Status: AC
  Administered 2024-03-19: 333 mL via INTRAVENOUS

## 2024-03-18 MED ORDER — LACTATED RINGERS IV SOLN: 500.0000 mL | INTRAVENOUS | Status: DC | PRN

## 2024-03-18 MED ORDER — OXYTOCIN-SODIUM CHLORIDE 30-0.9 UT/500ML-% IV SOLN: 2.5000 [IU]/h | INTRAVENOUS | Status: DC

## 2024-03-18 MED ORDER — LACTATED RINGERS IV SOLN: 500.0000 mL | Freq: Once | INTRAVENOUS | Status: DC

## 2024-03-18 MED ORDER — MISOPROSTOL 200 MCG PO TABS
ORAL_TABLET | ORAL | Status: AC
  Filled 2024-03-18: qty 4

## 2024-03-18 MED ORDER — OXYTOCIN-SODIUM CHLORIDE 30-0.9 UT/500ML-% IV SOLN
1.0000 m[IU]/min | INTRAVENOUS | Status: DC
  Administered 2024-03-18: 2 m[IU]/min via INTRAVENOUS
  Filled 2024-03-18 (×2): qty 500

## 2024-03-18 MED ORDER — TERBUTALINE SULFATE 1 MG/ML IJ SOLN
0.2500 mg | Freq: Once | INTRAMUSCULAR | Status: DC | PRN
Start: 2024-03-18 — End: 2024-03-19

## 2024-03-18 MED ORDER — LACTATED RINGERS IV SOLN: INTRAVENOUS | Status: DC

## 2024-03-18 MED ORDER — ONDANSETRON HCL 4 MG/2ML IJ SOLN
4.0000 mg | Freq: Four times a day (QID) | INTRAMUSCULAR | Status: DC | PRN
  Administered 2024-03-19: 4 mg via INTRAVENOUS
  Filled 2024-03-18: qty 2

## 2024-03-18 MED ORDER — AMMONIA AROMATIC IN INHA
RESPIRATORY_TRACT | Status: AC
  Filled 2024-03-18: qty 10

## 2024-03-18 MED ORDER — FENTANYL CITRATE (PF) 100 MCG/2ML IJ SOLN
50.0000 ug | INTRAMUSCULAR | Status: DC | PRN
  Administered 2024-03-18 (×2): 100 ug via INTRAVENOUS
  Filled 2024-03-18 (×2): qty 2

## 2024-03-18 MED ORDER — CYCLOBENZAPRINE HCL 5 MG PO TABS
5.0000 mg | ORAL_TABLET | Freq: Once | ORAL | Status: AC
  Administered 2024-03-18: 5 mg via ORAL
  Filled 2024-03-18: qty 1

## 2024-03-18 NOTE — OB Triage Note (Signed)
 Patient is a 23 yo, G2 P1, at 39 weeks 2 days. Patient presents with complaints of painful contractions.  Patient reports contractions every 7-8 minutes starting this morning at 2am. Patient states that at her prenatal appointment at 1:30pm yesterday, she experienced minor vision changes which she describes as light floaters. Patient states that was the only time and hasn't experienced since then. Denies headache or chest pain. Patient denies any vaginal bleeding or LOF. Patient reports FM. Monitors applied and assessing. VSS. Initial fetal heart tone 125. SVE 2.5/50/-3   CNM notified of patients arrival to unit. Plan for labor eval. Will re-check cervix in 2 hours.

## 2024-03-18 NOTE — OB Triage Note (Signed)
 Patient discharged. Significant other at bedside to drive patient home. Advised pt of signs of labor. Call or return for LOF, Bleeding, Headache/Vision Changes/Chest Pain, other signs or symptoms of labor. Pt verbalized understanding.

## 2024-03-18 NOTE — OB Triage Note (Signed)
 Patient is a G2P1001 at [redacted]w[redacted]d who presents to unit c/o contractions about 7 min apart. Reports +fetal movement and occasional clear fluid coming with each contraction, denies vaginal bleeding. External monitors applied and assessing. Initial FHT 135. Vital signs WDL.

## 2024-03-18 NOTE — OB Triage Provider Note (Signed)
      L&D OB Triage Note  SUBJECTIVE Laura Higgins is a 23 y.o. G2P1001 female at [redacted]w[redacted]d, EDD Estimated Date of Delivery: 03/23/24 who presented to triage with complaints of contractions. She denies loss of fluid and vaginal bleeding. She is feeling good fetal movement.   OB History  Gravida Para Term Preterm AB Living  2 1 1  0 0 1  SAB IAB Ectopic Multiple Live Births  0 0 0 0 1    # Outcome Date GA Lbr Len/2nd Weight Sex Type Anes PTL Lv  2 Current           1 Term 09/27/22 [redacted]w[redacted]d / 01:17 3080 g M Vag-Spont EPI  LIV     Name: HARUNA, ROHLFS     Apgar1: 8  Apgar5: 9    Medications Prior to Admission  Medication Sig Dispense Refill Last Dose/Taking   acetaminophen  (TYLENOL ) 500 MG tablet Take 1 tablet (500 mg total) by mouth every 6 (six) hours as needed. 30 tablet 0 Past Month   aspirin EC 81 MG tablet Take 162 mg by mouth daily. Swallow whole.   03/17/2024   Prenatal Vit-Fe Fumarate-FA (MULTIVITAMIN-PRENATAL) 27-0.8 MG TABS tablet Take 1 tablet by mouth daily at 12 noon.   03/17/2024     OBJECTIVE  Nursing Evaluation:   LMP 06/17/2023 (Exact Date)    Findings:   irregular contractions      NST was performed and has been reviewed by me.  NST INTERPRETATION: Category I  Mode: External Baseline Rate (A): 125 bpm Variability: Moderate Accelerations: 15 x 15 Decelerations: None     Contraction Frequency (min): Irregular pattern 4-10  ASSESSMENT Impression:  1.  Pregnancy:  G2P1001 at [redacted]w[redacted]d , EDD Estimated Date of Delivery: 03/23/24 2.  Reassuring fetal and maternal status 3.  No cervical change  PLAN 1. Current condition and above findings reviewed.  Reassuring fetal and maternal condition. 2. Discharge home with standard labor precautions given to return to L&D or call the office for problems. 3. Continue routine prenatal care.  [name], CNM

## 2024-03-18 NOTE — H&P (Signed)
 History and Physical   HPI  Laura Higgins is a 23 y.o. G2P1001 at [redacted]w[redacted]d Estimated Date of Delivery: 03/23/24 who is being admitted for PROM with irregular contractions. Pt state she felt like her water broke around 1130 this morning. She denies vaginal bleeding and is feeling good fetal movement.    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   Supervision of other normal pregnancy, antepartum 08/08/2023   History of maternal syphilis, currently pregnant, second trimester 03/18/2022   Rubella non-immune status, antepartum 03/18/2022   Abnormal blood finding 03/13/22 Hgb fractionation (low HgbA2, elevated Hgb F) 03/18/2022   cutter age 1 03/13/2022   Obesity affecting pregnancy 06/13/2015    Prenatal labs and studies: ABO, Rh: O/Positive/-- (04/02 1029) Antibody: Negative (04/02 1029) Rubella: <0.90 (04/02 1029) RPR: Reactive (07/22 1040)  HBsAg: Negative (04/02 1029)  HIV: Non Reactive (07/22 1040)  HAD:Endpupcz/-- (09/16 1616)   Past Medical History:  Diagnosis Date   Chlamydia infection 01/2020   Chlamydia infection 08/2019   Encounter for induction of labor 09/26/2022   Trichomonas infection 01/2020     Past Surgical History:  Procedure Laterality Date   NO PAST SURGERIES       Medications    Current Discharge Medication List     CONTINUE these medications which have NOT CHANGED   Details  acetaminophen  (TYLENOL ) 500 MG tablet Take 1 tablet (500 mg  total) by mouth every 6 (six) hours as needed. Qty: 30 tablet, Refills: 0    aspirin EC 81 MG tablet Take 162 mg by mouth daily. Swallow whole.    Prenatal Vit-Fe Fumarate-FA (MULTIVITAMIN-PRENATAL) 27-0.8 MG TABS tablet Take 1 tablet by mouth daily at 12 noon.         Allergies  Shellfish allergy  Review of Systems  Constitutional: negative Eyes: negative Ears, nose, mouth, throat, and face: negative Respiratory: negative Cardiovascular: negative Gastrointestinal: negative Genitourinary:negative Integument/breast: negative Hematologic/lymphatic: negative Musculoskeletal:negative Neurological: negative Behavioral/Psych: negative Endocrine: negative Allergic/Immunologic: negative  Physical Exam  BP 116/73   Pulse (!) 101   Temp 98 F (36.7 C) (Oral)   Resp 17   Ht 5' 1 (1.549 m)   Wt 101.2 kg   LMP 06/17/2023 (Exact Date)   BMI 42.14 kg/m   Lungs:  CTA B Cardio: RRR without M/R/G Abd: Soft, gravid, NT Presentation: cephalic EXT: No C/C/ 1+ Edema DTRs: 2+ B CERVIX: Dilation: 2 Effacement (%): 50 Station: -3 Exam by:: A Cortez RN  See Prenatal records for more detailed PE.     FHR:  Baseline: 130 bpm, Variability: Good {> 6 bpm), Accelerations: Reactive, and Decelerations: Absent  Toco: Uterine Contractions: irregular   Test Results  Results for orders placed or performed during the hospital encounter of 03/18/24 (from the past 24 hours)  Rupture of Membrane (ROM) Plus     Status: None   Collection Time: 03/18/24  5:53 PM  Result Value Ref Range   Rom Plus POSITIVE    Group B Strep positive  Assessment   G2P1001 at [redacted]w[redacted]d Estimated Date of Delivery: 03/23/24  The fetus is reassuring.   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 Admit to L&D :   2. EFM:-- Category 1 3. Fentanyl  or Epidural if desired.   4. Admission labs  5. Dr. Leigh notified of admission and plan of care.   Laura Higgins, CNM  03/18/2024 6:47 PM

## 2024-03-19 ENCOUNTER — Inpatient Hospital Stay: Payer: Self-pay | Admitting: Anesthesiology

## 2024-03-19 ENCOUNTER — Encounter: Payer: Self-pay | Admitting: Anesthesiology

## 2024-03-19 DIAGNOSIS — Z3481 Encounter for supervision of other normal pregnancy, first trimester: Secondary | ICD-10-CM

## 2024-03-19 DIAGNOSIS — E669 Obesity, unspecified: Secondary | ICD-10-CM

## 2024-03-19 DIAGNOSIS — O09293 Supervision of pregnancy with other poor reproductive or obstetric history, third trimester: Secondary | ICD-10-CM

## 2024-03-19 DIAGNOSIS — Z3A39 39 weeks gestation of pregnancy: Secondary | ICD-10-CM

## 2024-03-19 DIAGNOSIS — O99214 Obesity complicating childbirth: Secondary | ICD-10-CM

## 2024-03-19 DIAGNOSIS — Z3A12 12 weeks gestation of pregnancy: Secondary | ICD-10-CM

## 2024-03-19 DIAGNOSIS — O4202 Full-term premature rupture of membranes, onset of labor within 24 hours of rupture: Secondary | ICD-10-CM

## 2024-03-19 LAB — RPR
RPR Ser Ql: REACTIVE — AB
RPR Titer: 1:4 {titer}

## 2024-03-19 MED ORDER — BENZOCAINE-MENTHOL 20-0.5 % EX AERO
1.0000 | INHALATION_SPRAY | CUTANEOUS | Status: DC | PRN
  Administered 2024-03-20: 1 via TOPICAL
  Filled 2024-03-19: qty 56

## 2024-03-19 MED ORDER — MEASLES, MUMPS & RUBELLA VAC ~~LOC~~ SUSR
0.5000 mL | Freq: Once | SUBCUTANEOUS | Status: DC
Start: 2024-03-20 — End: 2024-03-22
  Filled 2024-03-19: qty 0.5

## 2024-03-19 MED ORDER — SIMETHICONE 80 MG PO CHEW: 80.0000 mg | CHEWABLE_TABLET | ORAL | Status: DC | PRN

## 2024-03-19 MED ORDER — TETANUS-DIPHTH-ACELL PERTUSSIS 5-2-15.5 LF-MCG/0.5 IM SUSP
0.5000 mL | Freq: Once | INTRAMUSCULAR | Status: DC
Start: 2024-03-20 — End: 2024-03-22
  Filled 2024-03-19: qty 0.5

## 2024-03-19 MED ORDER — COCONUT OIL OIL: 1.0000 | TOPICAL_OIL | Status: DC | PRN

## 2024-03-19 MED ORDER — BUPIVACAINE HCL (PF) 0.25 % IJ SOLN
INTRAMUSCULAR | Status: DC | PRN
  Administered 2024-03-19 (×2): 4 mL via EPIDURAL

## 2024-03-19 MED ORDER — PRENATAL MULTIVITAMIN CH
1.0000 | ORAL_TABLET | Freq: Every day | ORAL | Status: DC
  Administered 2024-03-20 – 2024-03-21 (×2): 1 via ORAL
  Filled 2024-03-19 (×3): qty 1

## 2024-03-19 MED ORDER — ACETAMINOPHEN 325 MG PO TABS
650.0000 mg | ORAL_TABLET | ORAL | Status: DC | PRN
  Administered 2024-03-19 – 2024-03-20 (×2): 650 mg via ORAL
  Filled 2024-03-19 (×2): qty 2

## 2024-03-19 MED ORDER — ONDANSETRON HCL 4 MG/2ML IJ SOLN: 4.0000 mg | INTRAMUSCULAR | Status: DC | PRN

## 2024-03-19 MED ORDER — LIDOCAINE HCL (PF) 1 % IJ SOLN
INTRAMUSCULAR | Status: DC | PRN
  Administered 2024-03-19 (×2): 3 mL via SUBCUTANEOUS

## 2024-03-19 MED ORDER — DIPHENHYDRAMINE HCL 25 MG PO CAPS
25.0000 mg | ORAL_CAPSULE | Freq: Four times a day (QID) | ORAL | Status: DC | PRN
  Administered 2024-03-20: 25 mg via ORAL
  Filled 2024-03-19: qty 1

## 2024-03-19 MED ORDER — IBUPROFEN 600 MG PO TABS
600.0000 mg | ORAL_TABLET | Freq: Four times a day (QID) | ORAL | Status: DC
  Administered 2024-03-19 – 2024-03-22 (×9): 600 mg via ORAL
  Filled 2024-03-19 (×10): qty 1

## 2024-03-19 MED ORDER — DIBUCAINE (PERIANAL) 1 % EX OINT: 1.0000 | TOPICAL_OINTMENT | CUTANEOUS | Status: DC | PRN

## 2024-03-19 MED ORDER — CALCIUM CARBONATE ANTACID 500 MG PO CHEW
2.0000 | CHEWABLE_TABLET | Freq: Once | ORAL | Status: AC
  Administered 2024-03-19: 400 mg via ORAL
  Filled 2024-03-19: qty 2

## 2024-03-19 MED ORDER — ONDANSETRON HCL 4 MG PO TABS: 4.0000 mg | ORAL_TABLET | ORAL | Status: DC | PRN

## 2024-03-19 MED ORDER — DOCUSATE SODIUM 100 MG PO CAPS
100.0000 mg | ORAL_CAPSULE | Freq: Two times a day (BID) | ORAL | Status: DC
  Administered 2024-03-20 – 2024-03-21 (×4): 100 mg via ORAL
  Filled 2024-03-19 (×4): qty 1

## 2024-03-19 MED ORDER — LIDOCAINE-EPINEPHRINE (PF) 1.5 %-1:200000 IJ SOLN
INTRAMUSCULAR | Status: DC | PRN
  Administered 2024-03-19: 3 mL via EPIDURAL

## 2024-03-19 MED ORDER — OXYCODONE HCL 5 MG PO TABS
5.0000 mg | ORAL_TABLET | Freq: Once | ORAL | Status: AC
  Administered 2024-03-20: 5 mg via ORAL
  Filled 2024-03-19: qty 1

## 2024-03-19 MED ORDER — WITCH HAZEL-GLYCERIN EX PADS
1.0000 | MEDICATED_PAD | CUTANEOUS | Status: DC | PRN
  Administered 2024-03-20: 1 via TOPICAL
  Filled 2024-03-19: qty 100

## 2024-03-19 MED ORDER — ZOLPIDEM TARTRATE 5 MG PO TABS: 5.0000 mg | ORAL_TABLET | Freq: Every evening | ORAL | Status: DC | PRN

## 2024-03-19 NOTE — Patient Instructions (Incomplete)
 Third Trimester of Pregnancy  The third trimester of pregnancy is from week 28 through week 40. This is months 7 through 9. The third trimester is a time when your baby is growing fast. Body changes during your third trimester Your body continues to change during this time. The changes usually go away after your baby is born. Physical changes You will continue to gain weight. You may get stretch marks on your hips, belly, and breasts. Your breasts will keep growing and may hurt. A yellow fluid (colostrum) may leak from your breasts. This is the first milk you're making for your baby. Your hair may grow faster and get thicker. In some cases, you may get hair loss. Your belly button may stick out. You may have more swelling in your hands, face, or ankles. Health changes You may have heartburn. You may feel short of breath. This is caused by the uterus that is now bigger. You may have more aches in the pelvis, back, or thighs. You may have more tingling or numbness in your hands, arms, and legs. You may pee more often. You may have trouble pooping (constipation) or swollen veins in the butt that can itch or get painful (hemorrhoids). Other changes You may have more problems sleeping. You may notice the baby moving lower in your belly (dropping). You may have more fluid coming from your vagina. Your joints may feel loose, and you may have pain around your pelvic bone. Follow these instructions at home: Medicines Take medicines only as told by your health care provider. Some medicines are not safe during pregnancy. Your provider may change the medicines that you take. Do not take any medicines unless told to by your provider. Take a prenatal vitamin that has at least 600 micrograms (mcg) of folic acid. Do not use herbal medicines, illegal drugs, or medicines that are not approved by your provider. Eating and drinking While you're pregnant your body needs additional nutrition to help  support your growing baby. Talk with your provider about your nutritional needs. Activity Most women are able to exercise regularly during pregnancy. Exercise routines may need to change at the end of your pregnancy. Talk to your provider about your activities and exercise routine. Relieving pain and discomfort Rest often with your legs raised if you have leg cramps or low back pain. Take warm sitz baths to soothe pain from hemorrhoids. Use hemorrhoid cream if your provider says it's okay. Wear a good, supportive bra if your breasts hurt. Do not use hot tubs, steam rooms, or saunas. Do not douche. Do not use tampons or scented pads. Safety Talk to your provider before traveling far distances. Wear your seatbelt at all times when you're in a car. Talk to your provider if someone hits you, hurts you, or yells at you. Preparing for birth To prepare for your baby: Take childbirth and breastfeeding classes. Visit the hospital and tour the maternity area. Buy a rear-facing car seat. Learn how to install it in your car. General instructions Avoid cat litter boxes and soil used by cats. These things carry germs that can cause harm to your pregnancy and your baby. Do not drink alcohol, smoke, vape, or use products with nicotine  or tobacco in them. If you need help quitting, talk with your provider. Keep all follow-up visits for your third trimester. Your provider will do more exams and tests during this trimester. Write down your questions. Take them to your prenatal visits. Your provider also will: Talk with you about  your overall health. Give you advice or refer you to specialists who can help with different needs, including: Mental health and counseling. Foods and healthy eating. Ask for help if you need help with food. Where to find more information American Pregnancy Association: americanpregnancy.org [employer]necologists: acog.org Office on [employer] Health:  [employer] Contact a health care provider if: You have a headache that does not go away when you take medicine. You have any of these problems: You can't eat or drink. You have nausea and vomiting. You have watery poop (diarrhea) for 2 days or more. You have pain when you pee, or your pee smells bad. You have been sick for 2 days or more and aren't getting better. Contact your provider right away if: You have any of these coming from your vagina: Abnormal discharge. Bad-smelling fluid. Bleeding. Your baby is moving less than usual. You have signs of labor: You have any contractions, belly cramping, or have pain in your pelvis or lower back before 37 weeks of pregnancy (preterm labor). You have regular contractions that are less than 5 minutes apart. Your water breaks. You have symptoms of high blood pressure or preeclampsia. These include: A severe, throbbing headache that does not go away. Sudden or extreme swelling of your face, hands, legs, or feet. Vision problems: You see spots. You have blurry vision. Your eyes are sensitive to light. If you can't reach your provider, go to an urgent care or emergency room. Get help right away if: You faint, become confused, or can't think clearly. You have chest pain or trouble breathing. You have any kind of injury, such as from a fall or a car crash. These symptoms may be an emergency. Call 911 right away. Do not wait to see if the symptoms will go away. Do not drive yourself to the hospital. This information is not intended to replace advice given to you by your health care provider. Make sure you discuss any questions you have with your health care provider. Document Revised: 02/27/2023 Document Reviewed: 09/27/2022 Elsevier Patient Education  2024 [employer]Nonstress Test: What to Expect A nonstress test, also called an NST, is done during pregnancy to check your baby's heartbeat. The procedure can help to show if your baby  is healthy. It may be done if: Your due date has passed. Your pregnancy is high risk. Your baby is moving less than normal. You've lost a previous pregnancy. Your baby is growing slowly. There's too much or too little fluid around your baby. The NST may be done in the third trimester to find out if it's best for your baby to be born early. During an NST, your baby's heartbeat is watched for at least 20 minutes. If the baby is healthy, the heart rate will go up when the baby moves and will return to normal when the baby rests. This should happen at least twice during the test. Tell a health care provider about: Any allergies you have. Any medical problems you have. All medicines you take. These include vitamins, herbs, eye drops, and creams. Any surgeries you've had. Any past pregnancies you've had. What are the risks? There are no risks to you or your baby from a nonstress test. This procedure shouldn't be painful or uncomfortable. What happens before? Eat a meal right before the test or as told by your health care team. Food may help the baby to move. Use the restroom right before the test. What happens during a nonstress test?  Two monitors will be placed on your belly. One will check your baby's heart rate, and the other will check for contractions. You may be asked to lie down on your side or to sit up. You may be given a button to press when you feel your baby move. If your baby seems to be sleeping, you may be asked to drink some juice or soda, eat a snack, or change positions. These steps may vary. Ask what you can expect. What can I expect after? Your team will talk with you about the results and tell you the next steps. If your team gave you any diet or activity instructions, make sure to follow them. Keep all follow-up visits. This is important to check on your health and the health of your baby. This information is not intended to replace advice given to you by your health  care provider. Make sure you discuss any questions you have with your health care provider. Document Revised: 05/22/2023 Document Reviewed: 05/22/2023 Elsevier Patient Education  2025 [employer].

## 2024-03-19 NOTE — Progress Notes (Signed)
 Labor Progress Note   ASSESSMENT/PLAN   Laura Higgins 23 y.o.   G2P1001  at [redacted]w[redacted]d admited for labor management.  FWB:  - Fetal well being assessed: Category 1        GBS: - GBS: positive - Prophylaxis: PCN- adequate tx  LABOR: - Now in active labor, and somewhat protracted. Suspect related to fetal asynclatism, which is improving with use of peanut ball and position changes. Doing well overall. - Pain Management: epidural in place - Continue pitocin  titration to keep MVUs >190 - Anticipate SVD   Principal Problem:   Labor and delivery, indication for care     SUBJECTIVE/OBJECTIVE   SUBJECTIVE:   No c/o.  OBJECTIVE: Vital Signs: Patient Vitals for the past 12 hrs:  BP Temp Temp src Pulse Resp SpO2  03/19/24 1700 (!) 115/59 -- -- (!) 105 -- 97 %  03/19/24 1645 -- 98 F (36.7 C) Oral -- -- --  03/19/24 1553 118/60 -- -- 97 -- --  03/19/24 1453 121/73 -- -- (!) 109 -- --  03/19/24 1445 -- 97.7 F (36.5 C) Oral -- -- --  03/19/24 1120 -- 98.1 F (36.7 C) Axillary -- -- --  03/19/24 1100 (!) 140/88 -- -- (!) 109 -- 97 %  03/19/24 1020 -- 97.6 F (36.4 C) Oral -- -- --  03/19/24 0720 -- 97.9 F (36.6 C) Oral -- 16 --  03/19/24 0700 122/81 -- -- (!) 107 -- 98 %  03/19/24 0600 139/78 -- -- (!) 118 -- 99 %    Last SVE:  Dilation: 8 (03/19/24 1715) Effacement (%): 100 (03/19/24 1715) Cervical Position: Middle (03/19/24 0941) Station: -1 (03/19/24 1715) Presentation: Vertex (03/19/24 1715) Exam by:: Charma, CNM (03/19/24 1715)  Membranes Sac Identifier: Sac 2 Membrane Status: SROM Rupture Date: 03/19/24 Rupture Time: 0300 Color: Clear Odor: Normal Amount: Small  FHR:   - Mode: Fetal scalp electrode  - Baseline Rate (A): 120 bpm  - Variability: Moderate  - Characteristics (ie - accels, decels): Accelerations: 15 x 15, Decelerations: None  UTERINE ACTIVITY:   - Mode: Intrauterine pressure catheter  - Contraction Frequency (min): 2-3.5  minutes  Pitocin : 16 mu

## 2024-03-19 NOTE — Progress Notes (Deleted)
    Return Prenatal Note   Subjective   23 y.o. G2P1001 at [redacted]w[redacted]d presents for this follow-up prenatal visit.  Patient *** Patient reports:    Objective   Flow sheet Vitals:   Total weight gain: 19 lb (8.618 kg)  General Appearance  No acute distress, well appearing, and well nourished Pulmonary   Normal work of breathing Neurologic   Alert and oriented to person, place, and time Psychiatric   Mood and affect within normal limits   Assessment/Plan   Plan  23 y.o. G2P1001 at [redacted]w[redacted]d presents for follow-up OB visit. Reviewed prenatal record including previous visit note.  No problem-specific Assessment & Plan notes found for this encounter.      No orders of the defined types were placed in this encounter.  No follow-ups on file.   Future Appointments  Date Time Provider Department Center  03/22/2024  2:15 PM AOB-NST ROOM [facility] None  03/22/2024  2:35 PM Slaughterbeck, Damien, CNM [facility] None    For next visit:  continue with routine prenatal care     Damien Parsley, CNM [facility] 10/10/252:04 PM

## 2024-03-19 NOTE — Discharge Summary (Addendum)
 Postpartum Discharge Summary  Date of Service updated target organ damage      Patient Name: Laura Higgins DOB: [birth year] MRN: [id number]  Date of admission: 03/18/2024 Delivery date:03/19/2024 Delivering provider: CHARMA DOMINO Date of discharge: 03/21/2024  Admitting diagnosis: Labor and delivery, indication for care [O75.9] Intrauterine pregnancy: [redacted]w[redacted]d     Secondary diagnosis:  Principal Problem:   SVD Active Problems:   Obesity affecting pregnancy   History of maternal syphilis, currently pregnant, second trimester   Rubella non-immune status, antepartum  Additional problems: Anemia    Discharge diagnosis: Term Pregnancy Delivered and Anemia                                              Post partum procedures:N/A Augmentation: Pitocin  Complications: ROM>24 hours  Hospital course: Onset of Labor With Vaginal Delivery      23 y.o. yo [id number] at [redacted]w[redacted]d was admitted in Latent Labor on 03/18/2024. Labor course was complicated by prolonged rupture of membranes  Membrane Rupture Time/Date: 10/9 @ 1130 Delivery Method:Vaginal, Spontaneous Operative Delivery:N/A Episiotomy: None Lacerations:  None Patient had an uncomplicated postpartum course.  She is ambulating, tolerating a regular diet, passing flatus, and urinating well. Patient is discharged home in stable condition on 03/21/24.  Newborn Data: Birth date:03/19/2024 Birth time:9:10 PM Gender:Female Living status:Living Apgars:8 ,9  Weight:3330 g  Magnesium Sulfate received: No BMZ received: No Rhophylac:N/A MMR: ordered PP T-DaP:ordered Flu: ordered RSV Vaccine received: No Transfusion:No Immunizations administered: Immunization History  Administered Date(s) Administered   HPV 9-valent 08/29/2015   Hepatitis A 11/28/2006, 08/29/2015   Hepatitis B 05-Jan-1999, 03/20/1999, 03/26/1999, 08/24/1999   Hpv-Unspecified 11/19/2013   Influenza-Unspecified 04/10/2022, 05/13/2023   MMR 09/29/2022   Tdap 02/01/2010,  07/16/2022    Recommend 6 weeks of prophylactic anticoagulation with LMWH or subcutaneous unfractionated heparin if 1 or more high risk factor is present.  Recommend 14 days of prophylactic anticoagulation with LMWH or subcutaneous unfractionated heparin if 3 or more moderate risk factors are present.   Risk assessment for postpartum VTE and prophylactic treatment:  High risk factors: None Moderate risk factors: BMI 40-60 kg/m2  Postpartum VTE prophylaxis with LMWH not indicated   Physical exam  Vitals:   03/20/24 1630 03/20/24 1905 03/20/24 2350 03/21/24 0822  BP: 104/73 121/82 103/62 103/61  Pulse: (!) 108 (!) 112 (!) 110 95  Resp: 20 18 18 18   Temp: 98.3 F (36.8 C) 98.8 F (37.1 C) 98.2 F (36.8 C) (!) 97.4 F (36.3 C)  TempSrc: Oral Oral Oral Oral  SpO2: 99% 99% 97% 98%  Weight:      Height:       General: alert, cooperative, and no distress Lochia: appropriate Uterine Fundus: firm Incision: N/A DVT Evaluation: No evidence of DVT seen on physical exam. Labs: Lab Results  Component Value Date   WBC 10.1 03/18/2024   HGB 10.2 (L) 03/18/2024   HCT 31.2 (L) 03/18/2024   MCV 71.7 (L) 03/18/2024   PLT 222 03/18/2024      Latest Ref Rng & Units 03/13/2022   10:35 AM  CMP  Glucose 70 - 99 mg/dL 886   BUN 6 - 20 mg/dL 8   Creatinine 9.42 - 8.99 mg/dL 9.38   Sodium 865 - 855 mmol/L 138   Potassium 3.5 - 5.2 mmol/L 4.3   Chloride 96 - 106 mmol/L 102  CO2 20 - 29 mmol/L 18   Calcium 8.7 - 10.2 mg/dL 9.3   Total Protein 6.0 - 8.5 g/dL 7.3   Total Bilirubin 0.0 - 1.2 mg/dL <9.7   Alkaline Phos 44 - 121 IU/L 96   AST 0 - 40 IU/L 25   ALT 0 - 32 IU/L 52    Edinburgh Score:    03/20/2024    3:00 AM  Edinburgh Postnatal Depression Scale Screening Tool  I have been able to laugh and see the funny side of things. 0  I have looked forward with enjoyment to things. 0  I have blamed myself unnecessarily when things went wrong. 2  I have been anxious or worried for  no good reason. 0  I have felt scared or panicky for no good reason. 0  Things have been getting on top of me. 0  I have been so unhappy that I have had difficulty sleeping. 0  I have felt sad or miserable. 1  I have been so unhappy that I have been crying. 0  The thought of harming myself has occurred to me. 0  Edinburgh Postnatal Depression Scale Total 3       After visit meds:  Allergies as of 03/21/2024       Reactions   Shellfish Allergy Nausea And Vomiting, Other (See Comments)   Funny feelings in throat        Medication List     TAKE these medications    acetaminophen  500 MG tablet Commonly known as: TYLENOL  Take 1 tablet (500 mg total) by mouth every 6 (six) hours as needed.   multivitamin-prenatal 27-0.8 MG Tabs tablet Take 1 tablet by mouth daily at 12 noon.         Discharge home in stable condition Infant Feeding: Bottle Infant Disposition:discharge home pending RPR results Discharge instruction: per After Visit Summary and Postpartum booklet. Activity: Advance as tolerated. Pelvic rest for 6 weeks.  Diet: routine diet Anticipated Birth Control: Nexplanon Postpartum VTE prophyhlaxis: No Postpartum Appointment:video visit in 2 weeks, office visit in 6 weeks Additional Postpartum F/U: N/A Future Appointments: Future Appointments  Date Time Provider Department Center  03/22/2024  2:15 PM [facility] RO[facility] None  03/22/2024  2:35 PM Slaughterbeck, Damien, CNM [facility] None   Follow up Visit:      03/21/2024 Eleanor CHRISTELLA Canny, CNM

## 2024-03-19 NOTE — Anesthesia Preprocedure Evaluation (Signed)
 Anesthesia Evaluation  Patient identified by MRN, date of birth, ID band Patient awake    Reviewed: Allergy & Precautions, NPO status , Patient's Chart, lab work & pertinent test results  Airway Mallampati: III  TM Distance: >3 FB Neck ROM: full    Dental  (+) Chipped   Pulmonary neg pulmonary ROS, former smoker   Pulmonary exam normal        Cardiovascular Exercise Tolerance: Good (-) hypertensionnegative cardio ROS Normal cardiovascular exam     Neuro/Psych    GI/Hepatic negative GI ROS,,,  Endo/Other    Class 3 obesity  Renal/GU   negative genitourinary   Musculoskeletal   Abdominal   Peds  Hematology negative hematology ROS (+)   Anesthesia Other Findings Past Medical History: 01/2020: Chlamydia infection 08/2019: Chlamydia infection 09/26/2022: Encounter for induction of labor 01/2020: Trichomonas infection  Past Surgical History: No date: NO PAST SURGERIES  BMI    Body Mass Index: 42.14 kg/m      Reproductive/Obstetrics (+) Pregnancy                              Anesthesia Physical Anesthesia Plan  ASA: 3  Anesthesia Plan: Epidural   Post-op Pain Management:    Induction:   PONV Risk Score and Plan:   Airway Management Planned: Natural Airway  Additional Equipment:   Intra-op Plan:   Post-operative Plan:   Informed Consent: I have reviewed the patients History and Physical, chart, labs and discussed the procedure including the risks, benefits and alternatives for the proposed anesthesia with the patient or authorized representative who has indicated his/her understanding and acceptance.     Dental Advisory Given  Plan Discussed with: Anesthesiologist  Anesthesia Plan Comments: (Patient reports no bleeding problems and no anticoagulant use.   Patient consented for risks of anesthesia including but not limited to:  - adverse reactions to  medications - risk of bleeding, infection and or nerve damage from epidural that could lead to paralysis - risk of headache or failed epidural - nerve damage due to positioning - that if epidural is used for C-section that there is a chance of epidural failure requiring spinal placement or conversion to GA - Damage to heart, brain, lungs, other parts of body or loss of life  Patient voiced understanding and assent.)        Anesthesia Quick Evaluation

## 2024-03-19 NOTE — Anesthesia Procedure Notes (Signed)
 Epidural Patient location during procedure: OB Start time: 03/19/2024 12:14 AM End time: 03/19/2024 12:29 AM  Staffing Anesthesiologist: Roemello Speyer, Fairy POUR, MD Performed: anesthesiologist   Preanesthetic Checklist Completed: patient identified, IV checked, site marked, risks and benefits discussed, surgical consent, monitors and equipment checked, pre-op evaluation and timeout performed  Epidural Patient position: sitting Prep: ChloraPrep Patient monitoring: heart rate, continuous pulse ox and blood pressure Approach: midline Location: L3-L4 Injection technique: LOR saline  Needle:  Needle type: Tuohy  Needle gauge: 17 G Needle length: 9 cm and 9 Needle insertion depth: 8 cm Catheter type: closed end flexible Catheter size: 19 Gauge Catheter at skin depth: 13 cm Test dose: negative and 1.5% lidocaine  with Epi 1:200 K  Assessment Sensory level: T10 Events: blood not aspirated, no cerebrospinal fluid, injection not painful, no injection resistance, no paresthesia and negative IV test  Additional Notes 2 attempts. Frank heme with aspiration with first catheter placement. Concern for intravascular catheter. Catheter removed and new epidural kit used for 2nd attempt. Normal aspiration and negative test dose with 2nd catheter.  Pt. Evaluated and documentation done after procedure finished. Patient identified. Risks/Benefits/Options discussed with patient including but not limited to bleeding, infection, nerve damage, paralysis, failed block, incomplete pain control, headache, blood pressure changes, nausea, vomiting, reactions to medication both or allergic, itching and postpartum back pain. Confirmed with bedside nurse the patient's most recent platelet count. Confirmed with patient that they are not currently taking any anticoagulation, have any bleeding history or any family history of bleeding disorders. Patient expressed understanding and wished to proceed. All questions were  answered. Sterile technique was used throughout the entire procedure. Please see nursing notes for vital signs. Test dose was given through epidural catheter and negative prior to continuing to dose epidural or start infusion. Warning signs of high block given to the patient including shortness of breath, tingling/numbness in hands, complete motor block, or any concerning symptoms with instructions to call for help. Patient was given instructions on fall risk and not to get out of bed. All questions and concerns addressed with instructions to call with any issues or inadequate analgesia.    Patient tolerated the insertion well without immediate complications.Reason for block:procedure for pain

## 2024-03-19 NOTE — Progress Notes (Signed)
 LABOR NOTE   Laura Higgins 23 y.o.GP@ at [redacted]w[redacted]d  SUBJECTIVE:  Comfortable with epidural Analgesia: Epidural  OBJECTIVE:  BP (!) 152/83   Pulse 96   Temp 98 F (36.7 C) (Oral)   Resp 17   Ht 5' 1 (1.549 m)   Wt 101.2 kg   LMP 06/17/2023 (Exact Date)   SpO2 99%   BMI 42.14 kg/m  No intake/output data recorded.   CERVIX: per RN exam  SVE:   Dilation: 4 Effacement (%): 80 Station: -3 Exam by:: Laura McGee RN CONTRACTIONS: regular, every 2 minutes FHR: Fetal heart tracing reviewed. Baseline: 125 bpm, Variability: Good {> 6 bpm), Accelerations: Reactive, and Decelerations: Absent Category I    Labs: Lab Results  Component Value Date   WBC 10.1 03/18/2024   HGB 10.2 (L) 03/18/2024   HCT 31.2 (L) 03/18/2024   MCV 71.7 (L) 03/18/2024   PLT 222 03/18/2024    ASSESSMENT: 1) Labor curve reviewed.       Progress: Early latent labor.     Membranes: ruptured, clear fluid     .       Principal Problem:   Labor and delivery, indication for care PROM  GBS positive   PLAN: continue present management and IV Pitocin  augmentation   Laura Higgins, CNM 03/19/2024 4:09 AM

## 2024-03-19 NOTE — Progress Notes (Signed)
 LABOR NOTE   Laura Higgins 23 y.o.GP@ at [redacted]w[redacted]d  SUBJECTIVE:  Denies any pressure or urge to push at this time. Resting comfortably Analgesia: Epidural  OBJECTIVE:  BP 139/78   Pulse (!) 118   Temp 98 F (36.7 C) (Oral)   Resp 16   Ht 5' 1 (1.549 m)   Wt 101.2 kg   LMP 06/17/2023 (Exact Date)   SpO2 99%   BMI 42.14 kg/m  No intake/output data recorded.  She has shown cervical change. CERVIX: 5 cm:  100%:   -2:   mid position:   soft SVE:   Dilation: 4 Effacement (%): 90 Station: -3 Exam by:: R McGee RN CONTRACTIONS: regular, every 2 minutes FHR: Fetal heart tracing reviewed. Baseline: 120 bpm, Variability: Good {> 6 bpm), Accelerations: Reactive, and Decelerations: Absent Category I    Labs: Lab Results  Component Value Date   WBC 10.1 03/18/2024   HGB 10.2 (L) 03/18/2024   HCT 31.2 (L) 03/18/2024   MCV 71.7 (L) 03/18/2024   PLT 222 03/18/2024    ASSESSMENT: 1) Labor curve reviewed.       Progress: Prolonged latent labor.     Membranes: ruptured, clear fluid     IUPC placed        Principal Problem:   Labor and delivery, indication for care   PLAN: Discussed use of IUPC with patient . Reviewed procedure risks and benefits. She is in agreement to placement. IUPC placed with ease. Titrate pitocin  to 200-250 MVUs.    [name], CNM  03/19/2024 6:35 AM

## 2024-03-19 NOTE — Progress Notes (Signed)
 Labor Progress Note   ASSESSMENT/PLAN   Laura Higgins 23 y.o.   G2P1001  at [redacted]w[redacted]d admited for labor management.  FWB:  - Fetal well being assessed: Category 1       - FSE placed due to difficulty with FHR tracing and distinguishing between FHR and maternal HR.  GBS: - GBS: Positive - Prophylaxis: PCN- Adequate tx  LABOR: - Now in active labor, doing well. Continue pitocin  titration to keep contractions q 2-3 mins. IUPC in place.  - Pain Management: epidural in place and working well - Anticipate SVD   Principal Problem:   Labor and delivery, indication for care   SUBJECTIVE/OBJECTIVE   SUBJECTIVE:  Comfortable with epidural. Not feeling any pressure. Has been able to sleep.  OBJECTIVE: Vital Signs: Patient Vitals for the past 12 hrs:  BP Temp Temp src Pulse Resp SpO2  03/19/24 1120 -- 98.1 F (36.7 C) Axillary -- -- --  03/19/24 1100 (!) 140/88 -- -- (!) 109 -- 97 %  03/19/24 1020 -- 97.6 F (36.4 C) Oral -- -- --  03/19/24 0720 -- 97.9 F (36.6 C) Oral -- 16 --  03/19/24 0700 122/81 -- -- (!) 107 -- 98 %  03/19/24 0600 139/78 -- -- (!) 118 -- 99 %  03/19/24 0500 (!) 108/53 98 F (36.7 C) Oral (!) 111 16 99 %  03/19/24 0300 129/67 -- -- (!) 114 -- 99 %   Timeline:  10/9 @ 1010: 2.5/50/-3 (Triage visit-> home) 1210: 2.5/50/-3, SROM at 1130 1730: 2/50/-3 2030: Pitocin  2200: 2.5/50/-3 2330: 3.5/70/-3 10/10 @ 0034: 4/80/-3, epidural 0300: 4/90/-3 0630: 5/100/-2, IUPC 0940: Unchanged, swollen, foley balloon wedged between fetal head and symphysis-> benadryl , tums   Last SVE:  Dilation: 7 (03/19/24 1227) Effacement (%): 100 (03/19/24 1227) Cervical Position: Middle (03/19/24 0941) Station: -2 (03/19/24 1227) Presentation: Vertex (03/19/24 0941) Exam by:: Charma, CNM (03/19/24 1227)  Membranes Sac Identifier: Sac 2 Membrane Status: SROM Rupture Date: 03/19/24 Rupture Time: 0300 Color: Clear Odor: Normal Amount: Small  FHR:   - Mode: External  -  Baseline Rate (A): 115 bpm  - Variability: Moderate  - Characteristics (ie - accels, decels): Accelerations: 15 x 15, Decelerations: None  UTERINE ACTIVITY:   - Mode: Intrauterine pressure catheter  - Contraction Frequency (min): 2.5-3 minutes  Pitocin : 10mu

## 2024-03-20 ENCOUNTER — Encounter: Payer: Self-pay | Admitting: Obstetrics

## 2024-03-20 NOTE — Progress Notes (Signed)
 Subjective:   Laura Higgins had a NSVB on 03/19/24 at 2110. Her labor was uncomplicated. Has had routine postpartum care.  Pt. Is eating, hydrating, and voiding regularly without difficulty. Has yet to have BM. She is strictly bottle feeding. Reports small amount of vaginal bleeding, denies passing large blood clots. Has had cramping abdomen pain relieved with tylenol /ibuprofen . Plans to use nexplanon for contraception. Denies anxiety/depression symptoms. Endorses good support from partner and family.   Patient has history of syphillis tx'ed in 2023. Titer was 1:8 on 09/10/23 and on 12/30/23, concern for reinfection [facility]. Completed Bicillin  x3 on 11/12/23. Within epic a titer from [facility] was 1:32 on 10/10/23. Titers were drawn on admission to L&D and are currently pending.  Objective:  Vital signs in last 24 hours: Temp:  [97.4 F (36.3 C)-98.3 F (36.8 C)] 98.2 F (36.8 C) (10/11 1233) Pulse Rate:  [97-125] 106 (10/11 1233) Resp:  [16-20] 18 (10/11 1233) BP: (99-136)/(52-87) 128/62 (10/11 1258) SpO2:  [92 %-100 %] 99 % (10/11 1233)    General: NAD Pulmonary: no increased work of breathing Abdomen: soft, non-tender Fundus: firm, midline, at umbilicus Lochia: light rubra, no clots Perineum: no erythema or foul odor discharge, minimal edema, laceration well approximated  Extremities: no edema, no erythema, no tenderness  Results for orders placed or performed during the hospital encounter of 03/18/24 (from the past 72 hours)  Rupture of Membrane (ROM) Plus     Status: None   Collection Time: 03/18/24  5:53 PM  Result Value Ref Range   Rom Plus POSITIVE     Comment: Performed at [facility], [address] [place], [place] [address]  CBC     Status: Abnormal   Collection Time: 03/18/24  8:09 PM  Result Value Ref Range   WBC 10.1 4.0 - 10.5 K/uL   RBC 4.35 3.87 - 5.11 MIL/uL   Hemoglobin 10.2 (L) 12.0 - 15.0 g/dL   HCT 68.7  (L) 63.9 - 46.0 %   MCV 71.7 (L) 80.0 - 100.0 fL   MCH 23.4 (L) 26.0 - 34.0 pg   MCHC 32.7 30.0 - 36.0 g/dL   RDW 81.3 (H) 88.4 - 84.4 %   Platelets 222 150 - 400 K/uL   nRBC 0.0 0.0 - 0.2 %    Comment: Performed at [facility], [address] [place], [place] [address]  Type and screen [facility]     Status: None   Collection Time: 03/18/24  8:09 PM  Result Value Ref Range   ABO/RH(D) O POS    Antibody Screen NEG    Sample Expiration      03/21/2024,2359 Performed at [facility], [address] [place], [place] [address]   RPR     Status: Abnormal   Collection Time: 03/18/24  8:09 PM  Result Value Ref Range   RPR Ser Ql Reactive (A) NON REACTIVE    Comment: SENT FOR CONFIRMATION   RPR Titer 1:4     Comment: Performed at [facility], [address], [place], [place] [address]    Assessment:   23 y.o. [phone] 1 day(s)  s/p NSVB Bottlefeeding Anemia secondary to acute blood loss- hemodynamically stable and symptomatic VSS Pain well controlled  Plan:    PO Fe Blood Type --/--/O POS (10/09 2009) / Rubella <0.90 (04/02 1029) / Varicella Immune Rhogam not indicated Tdap/varicella/rubella to be offered before discharge  if indicated Feeding plan bottle Education given regarding options for contraception, as well as compatibility with breast feeding if applicable.  Patient plans on Nexplanon for contraception. Continued routine postpartum care  Counseled on normal uterine involution and vaginal bleeding postpartum Anticipate discharge home tomorrow Continue to monitor for titers, treat if indicated    Lolita Loots, FNP, CNM [facility] 03/20/2024, 1:03 PM

## 2024-03-20 NOTE — Anesthesia Post-op Follow-up Note (Signed)
  Anesthesia Pain Follow-up Note  Patient: Laura Higgins  Day #: 1  Date of Follow-up: 03/20/2024 Time: 9:50 AM  Last Vitals:  Vitals:   03/20/24 0222 03/20/24 0506  BP: 120/75 109/67  Pulse: (!) 105 (!) 117  Resp: 18 18  Temp: 36.6 C 36.8 C  SpO2: 99% 98%    Level of Consciousness: alert  Pain: none   Side Effects:None  Catheter Site Exam:clean, dry     Plan: D/C from anesthesia care at surgeon's request  Prentice Murphy

## 2024-03-20 NOTE — Anesthesia Postprocedure Evaluation (Signed)
 Anesthesia Post Note  Patient: Laura Higgins  Procedure(s) Performed: AN AD HOC LABOR EPIDURAL  Patient location during evaluation: Mother Baby Anesthesia Type: Epidural Level of consciousness: awake and alert Pain management: pain level controlled Vital Signs Assessment: post-procedure vital signs reviewed and stable Respiratory status: spontaneous breathing, nonlabored ventilation and respiratory function stable Cardiovascular status: stable Postop Assessment: no headache, no backache, able to ambulate and adequate PO intake Anesthetic complications: no   No notable events documented.   Last Vitals:  Vitals:   03/20/24 0222 03/20/24 0506  BP: 120/75 109/67  Pulse: (!) 105 (!) 117  Resp: 18 18  Temp: 36.6 C 36.8 C  SpO2: 99% 98%    Last Pain:  Vitals:   03/20/24 0517  TempSrc:   PainSc: 0-No pain                 Prentice Murphy

## 2024-03-21 MED ORDER — INFLUENZA VIRUS VACC SPLIT PF (FLUZONE) 0.5 ML IM SUSY: 0.5000 mL | PREFILLED_SYRINGE | INTRAMUSCULAR | Status: DC

## 2024-03-21 NOTE — Final Progress Note (Signed)
 Post Partum Day 2 Subjective: Laura Higgins is feeling well overall. She is ambulating, voiding, and tolerating POs without difficulty. Her pain is well-controlled and her bleeding is WNL. Her mood is stable. Bottle feeing is going well.   Objective: Blood pressure 103/61, pulse 95, temperature (!) 97.4 F (36.3 C), temperature source Oral, resp. rate 18, height 5' 1 (1.549 m), weight 101.2 kg, last menstrual period 06/17/2023, SpO2 98%, unknown if currently breastfeeding.  Physical Exam:  General: alert Lochia: appropriate Uterine Fundus: firm Incision: N/A DVT Evaluation: No evidence of DVT seen on physical exam.  Recent Labs    03/18/24 2009  HGB 10.2*  HCT 31.2*    Assessment/Plan: Discharge home Discharge instructions reviewed Video visit in 2 weeks Office visit in 6 weeks with Nexplanon placement   LOS: 3 days   Eleanor CHRISTELLA Canny, CNM 03/21/2024, 11:30 AM

## 2024-03-22 ENCOUNTER — Telehealth: Payer: Self-pay | Admitting: Registered Nurse

## 2024-03-22 ENCOUNTER — Encounter: Admitting: Certified Nurse Midwife

## 2024-03-22 ENCOUNTER — Other Ambulatory Visit

## 2024-03-22 LAB — T.PALLIDUM AB, TOTAL: T Pallidum Abs: REACTIVE — AB

## 2024-03-22 NOTE — Telephone Encounter (Signed)
 The patient needs a 2 week video visit & 6 week PP/Nexplanon placement with Sarah. I contacted the patient via phone,to confirm scheduled visits. No answer, call could not be completed at this time.

## 2024-03-22 NOTE — Progress Notes (Signed)
 Patient discharged. Discharge instructions given. Patient verbalizes understanding. Patient rooming in with newborn. Newborn still currently a patient.

## 2024-03-22 NOTE — Discharge Instructions (Signed)

## 2024-03-22 NOTE — Progress Notes (Signed)
    Gerringer, Arlyne SAUNDERS, RN Registered Nurse Obstetrics/Gynecology   Progress Notes     Signed   Date of Service: 03/21/2024 10:45 PM   Signed      When entered room to do pt care pt sibling that is approximately 27 mnth old was still in room after grandma had left. I asked mom if grandma was coming back to pick up child and mom stated no that she (grandma) had to work and could not keep him. I instructed mom that our hospital visitation policy does not allow for anyone under the age of 70 to stay the night. Mom stated that she would call to have grandpa come and pick him up. No other needs or concerns expressed.

## 2024-03-22 NOTE — Progress Notes (Signed)
    Gerringer, Arlyne SAUNDERS, RN Registered Nurse Obstetrics/Gynecology   Progress Notes     Signed   Date of Service: 03/22/2024  2:37 AM   Signed      Entered room to check baby temp and sibling asleep in bed. Mom in bathroom and dad tending to pt. Asked if grandpa not coming to pick up sibling and dad stated he is not able to come. Reiterated that our visitation policy does not allow for sibling to stay the night. Instructed dad that I would have to notify supervisor due to safety concern of safe sleep for sibling. Asked dad if they had a car here at the hospital and he stated no. [facility] notified and AC

## 2024-03-22 NOTE — Progress Notes (Signed)
 Called to unit by RN for concern of one year old sibling spending the night and asleep in bed with parent. Plans for relative to come and take sibling home fell through and no one available to come get child. Parent states no car and carseat present at hospital.Visitation policy had been reviewed with parents, who verbalized understanding. Safe sleep for sibling had also been discussed, specifically no co-sleeping. AC and Administrator on call for [facility] notified and situation reviewed. Agreed no way for Dad and one year old to get home at this hour. TOC consult pull in, hourly rounding continues, and will rediscuss plans for care of sibling in the morning.

## 2024-04-05 NOTE — Progress Notes (Deleted)
   Virtual Visit via Video Note   I connected with Laura Higgins  on 04/05/24 2:24 PM by a video enabled telemedicine application and verified that I am speaking with the correct person using two identifiers.   Location: Patient: *** Provider: [facility]   I discussed the limitations of evaluation and management by telemedicine and the availability of in person appointments. The patient expressed understanding and agreed to proceed. Subjective:    Subjective  Laura Higgins is a 23 y.o. female who presents for a postpartum visit. She is{1-10:13787} {time; units:18646}  postpartum following a {delivery:12449}. I have fully reviewed the prenatal and intrapartum course. The delivery was at *** .  Anesthesia: {anesthesia types:812}.    Postpartum course has been normal. She has no pain and no other concerns. Baby's course has been normal. Baby is feeding by  {breast/bottle:69}. Bleeding {vag bleed:12292}. Bowel function is {normal:32111}. Bladder function is {normal:32111}.  .  Contraception method: {contraceptive method:5051}. Postpartum depression screening:      Review of Systems Pertinent positives noted in HPI. Remainder of comprehensive ROS otherwise negative.     Objective:      Objective [] Expand by Default There were no vitals taken for this visit.  General:  alert, cooperative, and no distress        Assessment:    Normal postpartum course, mood stable.   Plan:      Plan -Anticipatory guidance about the postpartum period -Reviewed s/s of PPD and PPA -Discussed when to seek medical care -Office visit at 6 weeks postpartum  I discussed the assessment and treatment plan with the patient. The patient was provided an opportunity to ask questions and all were answered. The patient agreed with the plan and demonstrated an understanding of the instructions.   The patient was advised to call back or seek an in-person evaluation if the symptoms worsen or if the  condition fails to improve as anticipated.   I provided *** minutes of non-face-to-face time during this encounter.     Waddell JONELLE Maxim, CMA

## 2024-04-08 ENCOUNTER — Telehealth: Admitting: Registered Nurse

## 2024-04-28 NOTE — Progress Notes (Deleted)
 SUBJECTIVE  Laura Higgins is a 23 y.o. [phone] who gave birth to a *** infant at Unknown weeks via *** on ***.  She is {Breast or [phone]}. She is here today for a PP follow-up visit.   ROS {Ros; complete female:19594}  Mood    Bowel function: *** Bladder function: *** Vaginal bleeding: *** Pain: *** Intercourse: ***  Denies difficulty breathing, chest pain, lower extremity pain or swelling, excessive vaginal bleeding, vaginal pain.   OBJECTIVE  There is no height or weight on file to calculate BMI.  There were no vitals taken for this visit. Shellfish allergy  Past Medical/Surgical History Past Medical History:  Diagnosis Date   Chlamydia infection 01/2020   Chlamydia infection 08/2019   Encounter for induction of labor 09/26/2022   Trichomonas infection 01/2020   Past Surgical History:  Procedure Laterality Date   NO PAST SURGERIES      Last Pap: {findings; last pap:13140}  {Exam; complete female:17926}  ASSESSMENT  PLAN  The pregnancy intention screening data noted above was reviewed. Potential methods of contraception were discussed. The patient elected to proceed with No data recorded.  Discussed PP mood changes and coping strategies. Reviewed preventive care (dental, eye exam, vaccines, routine screenings). Follow up *** for annual exam or PRN.  [name], CNM

## 2024-04-28 NOTE — Progress Notes (Deleted)
 ENCOUNTER FOR NEXPLANON INSERTION  SUBJECTIVE Laura Higgins is a 23 y.o. [phone] who presents today for insertion of Nexplanon contraceptive device. She desires reversible long-term contraception. We have thoroughly reviewed the risks, benefits, and alternatives, and she has elected to proceed with Nexplanon insertion.   OBJECTIVE There were no vitals taken for this visit.  UPT:  Procedure Note Right/Left Arm*** Sterile Preparation:  Betadine/Chloraprep***  Insertion site was selected *** cm from the medial epicondyle. The procedure area was prepped in sterile fashion. Adequate anesthesia was achieved with 2 mL of 1% lidocaine  injected subcutaneously. The Nexplanon applicator was inserted subcutaneously and the Nexplanon device was delivered. The applicator was removed from the insertion site. The capsule was palpated by myself and the patient to confirm satisfactory placement. Blood loss was minimal. A pressure dressing was applied.  The patient tolerated the procedure well with no complications. Standard post-procedure care and return precautions were explained.   [name], CNM

## 2024-04-29 ENCOUNTER — Ambulatory Visit: Admitting: Registered Nurse

## 2024-05-20 ENCOUNTER — Telehealth: Payer: Self-pay | Admitting: Licensed Clinical Social Worker

## 2024-05-20 NOTE — Telephone Encounter (Signed)
-----   Message from Andriette DELENA Lambing sent at 05/20/2024  3:39 PM EST ----- Regarding: Mental Health Referral Hello,  This lady was on my caseload, but has lost contact with me until today. She reached back out at a new number and stated that her mental health is not doing well. She requested that I send a mental health referral. Her new phone number is [phone].  Thank you, Jenna Brackmn
